# Patient Record
Sex: Male | Born: 1975 | Hispanic: Yes | Marital: Married | Smoking: Never smoker
Health system: Southern US, Community
[De-identification: ages and names within clinical notes are randomized; demographics above are authoritative.]

## PROBLEM LIST (undated history)

## (undated) DIAGNOSIS — E119 Type 2 diabetes mellitus without complications: Secondary | ICD-10-CM

## (undated) DIAGNOSIS — M1990 Unspecified osteoarthritis, unspecified site: Secondary | ICD-10-CM

## (undated) DIAGNOSIS — M109 Gout, unspecified: Secondary | ICD-10-CM

## (undated) HISTORY — PX: JOINT REPLACEMENT: SHX530

## (undated) HISTORY — PX: KNEE SURGERY: SHX244

---

## 2014-09-25 ENCOUNTER — Emergency Department (HOSPITAL_COMMUNITY)
Admission: EM | Admit: 2014-09-25 | Discharge: 2014-09-25 | Disposition: A | Discharge status: Home / Self Care | Payer: Medicaid - Out of State | Attending: Emergency Medicine | Admitting: Emergency Medicine

## 2014-09-25 ENCOUNTER — Encounter (HOSPITAL_COMMUNITY): Payer: Self-pay | Admitting: Emergency Medicine

## 2014-09-25 DIAGNOSIS — R109 Unspecified abdominal pain: Secondary | ICD-10-CM | POA: Diagnosis present

## 2014-09-25 DIAGNOSIS — M545 Low back pain, unspecified: Secondary | ICD-10-CM

## 2014-09-25 LAB — URINALYSIS, ROUTINE W REFLEX MICROSCOPIC
Bilirubin Urine: NEGATIVE
Glucose, UA: NEGATIVE mg/dL
Hgb urine dipstick: NEGATIVE
KETONES UR: NEGATIVE mg/dL
LEUKOCYTES UA: NEGATIVE
NITRITE: NEGATIVE
PROTEIN: NEGATIVE mg/dL
SPECIFIC GRAVITY, URINE: 1.016 (ref 1.005–1.030)
Urobilinogen, UA: 0.2 mg/dL (ref 0.0–1.0)
pH: 5.5 (ref 5.0–8.0)

## 2014-09-25 LAB — I-STAT TROPONIN, ED: Troponin i, poc: 0 ng/mL (ref 0.00–0.08)

## 2014-09-25 LAB — COMPREHENSIVE METABOLIC PANEL
ALK PHOS: 93 U/L (ref 38–126)
ALT: 29 U/L (ref 17–63)
AST: 25 U/L (ref 15–41)
Albumin: 3.9 g/dL (ref 3.5–5.0)
Anion gap: 10 (ref 5–15)
BUN: 13 mg/dL (ref 6–20)
CO2: 25 mmol/L (ref 22–32)
Calcium: 9.4 mg/dL (ref 8.9–10.3)
Chloride: 103 mmol/L (ref 101–111)
Creatinine, Ser: 0.95 mg/dL (ref 0.61–1.24)
Glucose, Bld: 117 mg/dL — ABNORMAL HIGH (ref 65–99)
Potassium: 3.9 mmol/L (ref 3.5–5.1)
SODIUM: 138 mmol/L (ref 135–145)
TOTAL PROTEIN: 7.4 g/dL (ref 6.5–8.1)
Total Bilirubin: 0.9 mg/dL (ref 0.3–1.2)

## 2014-09-25 LAB — CBC WITH DIFFERENTIAL/PLATELET
BASOS ABS: 0 10*3/uL (ref 0.0–0.1)
BASOS PCT: 0 % (ref 0–1)
EOS ABS: 0.1 10*3/uL (ref 0.0–0.7)
EOS PCT: 1 % (ref 0–5)
HCT: 43.6 % (ref 39.0–52.0)
Hemoglobin: 14.4 g/dL (ref 13.0–17.0)
LYMPHS PCT: 24 % (ref 12–46)
Lymphs Abs: 3.1 10*3/uL (ref 0.7–4.0)
MCH: 29.6 pg (ref 26.0–34.0)
MCHC: 33 g/dL (ref 30.0–36.0)
MCV: 89.7 fL (ref 78.0–100.0)
Monocytes Absolute: 0.8 10*3/uL (ref 0.1–1.0)
Monocytes Relative: 6 % (ref 3–12)
Neutro Abs: 9 10*3/uL — ABNORMAL HIGH (ref 1.7–7.7)
Neutrophils Relative %: 69 % (ref 43–77)
Platelets: 281 10*3/uL (ref 150–400)
RBC: 4.86 MIL/uL (ref 4.22–5.81)
RDW: 13.3 % (ref 11.5–15.5)
WBC: 13.1 10*3/uL — AB (ref 4.0–10.5)

## 2014-09-25 LAB — LIPASE, BLOOD: Lipase: 15 U/L — ABNORMAL LOW (ref 22–51)

## 2014-09-25 MED ORDER — CYCLOBENZAPRINE HCL 10 MG PO TABS: 10.0000 mg | ORAL_TABLET | Freq: Three times a day (TID) | ORAL | Status: DC | PRN

## 2014-09-25 MED ORDER — HYDROCODONE-ACETAMINOPHEN 5-325 MG PO TABS
2.0000 | ORAL_TABLET | Freq: Once | ORAL | Status: AC
Start: 2014-09-25 — End: 2014-09-25
  Administered 2014-09-25: 2 via ORAL
  Filled 2014-09-25: qty 2

## 2014-09-25 MED ORDER — CYCLOBENZAPRINE HCL 10 MG PO TABS
10.0000 mg | ORAL_TABLET | Freq: Once | ORAL | Status: AC
  Administered 2014-09-25: 10 mg via ORAL
  Filled 2014-09-25: qty 1

## 2014-09-25 MED ORDER — KETOROLAC TROMETHAMINE 60 MG/2ML IM SOLN
60.0000 mg | Freq: Once | INTRAMUSCULAR | Status: AC
  Administered 2014-09-25: 60 mg via INTRAMUSCULAR
  Filled 2014-09-25: qty 2

## 2014-09-25 MED ORDER — NAPROXEN 500 MG PO TABS: 500.0000 mg | ORAL_TABLET | Freq: Two times a day (BID) | ORAL | Status: DC

## 2014-09-25 NOTE — ED Provider Notes (Signed)
CSN: [id number]     Arrival date & time 09/25/14  1825 History   First MD Initiated Contact with Patient 09/25/14 2005     Chief Complaint  Patient presents with  . Abdominal Pain  . Flank Pain     (Consider location/radiation/quality/duration/timing/severity/associated sxs/prior Treatment) HPI Comments: 39 year old male with no significant medical history, nonsmoker presents with worsening bilateral flank pain for 3 days worse with position and movement. No injuries however patient regularly left heavy things at work. Patient had dark urine with fishy odor 3 days prior that resolved. This improved with increased water intake. No weakness or numbness in his legs no urinary changes. No kidney stone history please no fevers or chills. Ache sensation  Patient is a 39 y.o. male presenting with abdominal pain and flank pain. The history is provided by the patient.  Abdominal Pain Associated symptoms: no chest pain, no chills, no dysuria, no fever, no shortness of breath and no vomiting   Flank Pain Associated symptoms include abdominal pain (mostly back no focal abdominal pain). Pertinent negatives include no chest pain, no headaches and no shortness of breath.    History reviewed. No pertinent past medical history. Past Surgical History  Procedure Laterality Date  . Knee surgery     History reviewed. No pertinent family history. History  Substance Use Topics  . Smoking status: Never Smoker   . Smokeless tobacco: Not on file  . Alcohol Use: Yes     Comment: occasional    Review of Systems  Constitutional: Negative for fever and chills.  HENT: Negative for congestion.   Eyes: Negative for visual disturbance.  Respiratory: Negative for shortness of breath.   Cardiovascular: Negative for chest pain.  Gastrointestinal: Positive for abdominal pain (mostly back no focal abdominal pain). Negative for vomiting.  Genitourinary: Positive for flank pain. Negative for dysuria.   Musculoskeletal: Positive for back pain. Negative for neck pain and neck stiffness.  Skin: Negative for rash.  Neurological: Negative for light-headedness and headaches.      Allergies  Shrimp  Home Medications   Prior to Admission medications   Medication Sig Start Date End Date Taking? Authorizing Provider  acetaminophen (TYLENOL) 500 MG tablet Take 500 mg by mouth every 6 (six) hours as needed for moderate pain.   Yes Historical Provider, MD  ibuprofen (ADVIL,MOTRIN) 200 MG tablet Take 400 mg by mouth every 6 (six) hours as needed for headache or moderate pain.   Yes Historical Provider, MD  cyclobenzaprine (FLEXERIL) 10 MG tablet Take 1 tablet (10 mg total) by mouth 3 (three) times daily as needed for muscle spasms. 09/25/14   Blane Ohara, MD  naproxen (NAPROSYN) 500 MG tablet Take 1 tablet (500 mg total) by mouth 2 (two) times daily. 09/25/14   Blane Ohara, MD   BP 136/67 mmHg  Pulse 80  Temp(Src) 98.4 F (36.9 C) (Oral)  Resp 18  SpO2 98% Physical Exam  Constitutional: He is oriented to person, place, and time. He appears well-developed and well-nourished.  HENT:  Head: Normocephalic and atraumatic.  Eyes: Conjunctivae are normal. Right eye exhibits no discharge. Left eye exhibits no discharge.  Neck: Normal range of motion. Neck supple. No tracheal deviation present.  Cardiovascular: Normal rate and regular rhythm.   Pulmonary/Chest: Effort normal and breath sounds normal.  Abdominal: Soft. He exhibits no distension. There is no tenderness. There is no guarding.  Musculoskeletal: He exhibits tenderness (tender and tight musculature paraspinal lumbar bilateral, no midline tenderness). He exhibits no edema.  Neurological: He is alert and oriented to person, place, and time. He has normal strength. No sensory deficit.  Skin: Skin is warm. No rash noted.  Psychiatric: He has a normal mood and affect.  Nursing note and vitals reviewed.   ED Course  Procedures  (including critical care time) Labs Review Labs Reviewed  CBC WITH DIFFERENTIAL/PLATELET - Abnormal; Notable for the following:    WBC 13.1 (*)    Neutro Abs 9.0 (*)    All other components within normal limits  COMPREHENSIVE METABOLIC PANEL - Abnormal; Notable for the following:    Glucose, Bld 117 (*)    All other components within normal limits  LIPASE, BLOOD - Abnormal; Notable for the following:    Lipase 15 (*)    All other components within normal limits  URINALYSIS, ROUTINE W REFLEX MICROSCOPIC (NOT AT [facility]) - Abnormal; Notable for the following:    APPearance CLOUDY (*)    All other components within normal limits  I-STAT TROPOININ, ED    Imaging Review No results found.   EKG Interpretation   Date/Time:  Sunday September 25 2014 18:40:53 EDT Ventricular Rate:  85 PR Interval:  169 QRS Duration: 83 QT Interval:  341 QTC Calculation: 405 R Axis:   63 Text Interpretation:  Sinus rhythm Probable left atrial enlargement  Confirmed by Emad Brechtel  MD, Hamp Moreland ([id number]) on 09/25/2014 8:07:01 PM      MDM   Final diagnoses:  Lumbar pain determined by palpation   Patient presents with back pain and urinary concerns. Blood work and urinalysis reviewed unremarkable. Patient has reproducible back pain on exam. Low suspicion for any emergent process at this time. Discussed supportive care and strict reasons to return.  Patient and family okay holding on CT scan is very low pretest probability.  Results and differential diagnosis were discussed with the patient/parent/guardian. Close follow up outpatient was discussed, comfortable with the plan.   Medications  HYDROcodone-acetaminophen (NORCO/VICODIN) 5-325 MG per tablet 2 tablet (2 tablets Oral Given 09/25/14 2123)  cyclobenzaprine (FLEXERIL) tablet 10 mg (10 mg Oral Given 09/25/14 2123)  ketorolac (TORADOL) injection 60 mg (60 mg Intramuscular Given 09/25/14 2123)    Filed Vitals:   09/25/14 1836 09/25/14 2056  BP: 140/90 136/67   Pulse: 84 80  Temp: 98.4 F (36.9 C) 98.4 F (36.9 C)  TempSrc: Oral Oral  Resp: 18 18  SpO2: 99% 98%    Final diagnoses:  Lumbar pain determined by palpation        Blane Ohara, MD 09/25/14 2138

## 2014-09-25 NOTE — ED Notes (Signed)
Pt c/o lateral back pain across entire lateral back x 4 days, worsened 2 days ago, along with sharp abdominal pain, worse at [facility]Q. Pt denies dysuria, but states he had green malodorous urine 2 days ago.

## 2014-09-25 NOTE — ED Notes (Signed)
Pt reports flank pain x 3 days that is worse when he lays down. 4 days ago pt reports his urine had a "fishy" odor. 3 days ago pt reports his urine was greenish in color. He increased his fluid intake and now his urine is "tea colored"

## 2014-09-25 NOTE — Discharge Instructions (Signed)
If you were given medicines take as directed.  If you are on coumadin or contraceptives realize their levels and effectiveness is altered by many different medicines.  If you have any reaction (rash, tongues swelling, other) to the medicines stop taking and see a physician.    If your blood pressure was elevated in the ER make sure you follow up for management with a primary doctor or return for chest pain, shortness of breath or stroke symptoms. Return for fevers, leg weakness or numbness, urinary incontinence or retention or new symptoms. Please follow up as directed and return to the ER or see a physician for new or worsening symptoms.  Thank you. Filed Vitals:   09/25/14 1836 09/25/14 2056  BP: 140/90 136/67  Pulse: 84 80  Temp: 98.4 F (36.9 C) 98.4 F (36.9 C)  TempSrc: Oral Oral  Resp: 18 18  SpO2: 99% 98%

## 2015-08-12 ENCOUNTER — Emergency Department (HOSPITAL_COMMUNITY)
Admission: EM | Admit: 2015-08-12 | Discharge: 2015-08-12 | Disposition: A | Discharge status: Home / Self Care | Payer: Medicaid - Out of State | Attending: Emergency Medicine | Admitting: Emergency Medicine

## 2015-08-12 ENCOUNTER — Encounter (HOSPITAL_COMMUNITY): Payer: Self-pay | Admitting: *Deleted

## 2015-08-12 DIAGNOSIS — M25462 Effusion, left knee: Secondary | ICD-10-CM | POA: Insufficient documentation

## 2015-08-12 DIAGNOSIS — Z791 Long term (current) use of non-steroidal anti-inflammatories (NSAID): Secondary | ICD-10-CM | POA: Insufficient documentation

## 2015-08-12 DIAGNOSIS — M25561 Pain in right knee: Secondary | ICD-10-CM | POA: Insufficient documentation

## 2015-08-12 DIAGNOSIS — M25562 Pain in left knee: Secondary | ICD-10-CM | POA: Insufficient documentation

## 2015-08-12 DIAGNOSIS — Z9889 Other specified postprocedural states: Secondary | ICD-10-CM | POA: Insufficient documentation

## 2015-08-12 MED ORDER — IBUPROFEN 600 MG PO TABS: 600.0000 mg | ORAL_TABLET | Freq: Four times a day (QID) | ORAL | Status: DC | PRN

## 2015-08-12 NOTE — ED Notes (Signed)
Pt reports his knee pain started last night In Lt knee and the right knee started to hurt this morning. Pt reports swelling to both knees.

## 2015-08-12 NOTE — ED Notes (Signed)
Pt verbalized understanding of d/c instructions and has no further questions. Pt stable and ambulatory with crutches. Pt to follow up with ortho.

## 2015-08-12 NOTE — Discharge Instructions (Signed)
Dolor de rodilla (Knee Pain) El dolor de rodilla es un sntoma muy comn y puede tener muchas causas. Suele desaparecer cuando se siguen las indicaciones del mdico en lo que respecta a aliviar el dolor y las molestias en la casa. Sin embargo, puede progresar hasta convertirse en una afeccin que requiere tratamiento. Algunas afecciones pueden incluir lo siguiente:  Artritis por uso y desgaste (artrosis).  Artritis por hinchazn e irritacin (artritis reumatoide o gota).  Un quiste o un crecimiento en la rodilla.  Una infeccin en la articulacin de la rodilla.  Una lesin que no se cura.  Dao, hinchazn o irritacin de los tejidos que sostienen la rodilla (distensin de ligamentos o tendinitis). Si el dolor de rodilla persiste, tal vez haya que realizar ms estudios para diagnosticar la afeccin, los cuales pueden incluir radiografas u otros estudios de diagnstico por imgenes de la rodilla. Adems, es posible que haya que extraerle lquido de la rodilla. El tratamiento del dolor continuo de rodilla depende de la causa, pero puede incluir lo siguiente:  Medicamentos para aliviar el dolor o reducir la hinchazn.  Inyecciones de corticoides en la rodilla.  Fisioterapia.  Ciruga. INSTRUCCIONES PARA EL CUIDADO EN EL HOGAR  Tome los medicamentos solamente como se lo haya indicado el mdico.  Mantenga la rodilla en reposo y en alto (elevada) mientras est descansando.  No haga cosas que le causen dolor o que lo intensifiquen.  Evite las actividades o los ejercicios de alto impacto, como correr, saltar la soga o hacer saltos de tijera.  Aplique hielo sobre la zona de la rodilla:  Ponga el hielo en una bolsa plstica.  Coloque una toalla entre la piel y la bolsa de hielo.  Coloque el hielo durante 20 minutos, 2 a 3 veces por da.  Pregntele al mdico si debe usar una rodillera elstica.  Cuando duerma, pngase una almohada debajo de la rodilla.  Baje de peso si es  necesario. El peso extra puede generar presin en la rodilla.  No consuma ningn producto que contenga tabaco, lo que incluye cigarrillos, tabaco de mascar o cigarrillos electrnicos. Si necesita ayuda para dejar de fumar, consulte al mdico. Fumar puede retrasar la curacin de cualquier problema que tenga en el hueso y la articulacin. SOLICITE ATENCIN MDICA SI:  El dolor de rodilla contina, cambia o empeora.  Tiene fiebre junto con dolor de rodilla.  La rodilla se le tuerce o se le traba.  La rodilla est ms hinchada. SOLICITE ATENCIN MDICA DE INMEDIATO SI:   La articulacin de la rodilla est caliente al tacto.  Tiene dolor en el pecho o dificultad para respirar.   Esta informacin no tiene como fin reemplazar el consejo del mdico. Asegrese de hacerle al mdico cualquier pregunta que tenga.   Document Released: 09/18/2007 Document Revised: 04/22/2014 Elsevier Interactive Patient Education 2016 [employer]  

## 2015-08-12 NOTE — ED Provider Notes (Signed)
CSN: [id number]     Arrival date & time 08/12/15  1844 History  By signing my name below, I, Emmanuella Mensah, attest that this documentation has been prepared under the direction and in the presence of Roxy Horseman, PA-C. Electronically Signed: Angelene Giovanni, ED Scribe. 08/12/2015. 7:00 PM.    Chief Complaint  Patient presents with  . Knee Pain   The history is provided by the patient. No language interpreter was used.   HPI Comments: Benjamen Koelling is a 40 y.o. male with a hx of right knee surgery who presents to the Emergency Department complaining of gradually worsening left knee pain onset yesterday. Pt reports associated left knee swelling. He adds that his right knee is beginning to bother him as well. He states that he was sweeping and mopping the floors last night prior to onset. No alleviating factors noted. Pt has not tried any medication PTA. Pt denies any difficulties with ambulation, fever, chills, or any open wounds.   History reviewed. No pertinent past medical history. Past Surgical History  Procedure Laterality Date  . Knee surgery      RT knee surgery   History reviewed. No pertinent family history. Social History  Substance Use Topics  . Smoking status: Never Smoker   . Smokeless tobacco: None  . Alcohol Use: Yes     Comment: occasional    Review of Systems  Constitutional: Negative for chills.  Musculoskeletal: Positive for joint swelling and arthralgias (left knee). Negative for gait problem.  Skin: Negative for wound.      Allergies  Shrimp  Home Medications   Prior to Admission medications   Medication Sig Start Date End Date Taking? Authorizing Provider  acetaminophen (TYLENOL) 500 MG tablet Take 500 mg by mouth every 6 (six) hours as needed for moderate pain.    Historical Provider, MD  cyclobenzaprine (FLEXERIL) 10 MG tablet Take 1 tablet (10 mg total) by mouth 3 (three) times daily as needed for muscle spasms. 09/25/14   Blane Ohara, MD   ibuprofen (ADVIL,MOTRIN) 200 MG tablet Take 400 mg by mouth every 6 (six) hours as needed for headache or moderate pain.    Historical Provider, MD  naproxen (NAPROSYN) 500 MG tablet Take 1 tablet (500 mg total) by mouth 2 (two) times daily. 09/25/14   Blane Ohara, MD   BP 135/82 mmHg  Pulse 81  Temp(Src) 98.6 F (37 C) (Oral)  Resp 18  SpO2 100% Physical Exam  Constitutional: He is oriented to person, place, and time. He appears well-developed and well-nourished.  HENT:  Head: Normocephalic and atraumatic.  Eyes: Conjunctivae and EOM are normal.  Neck: Normal range of motion.  Cardiovascular: Normal rate.   Pulmonary/Chest: Effort normal.  Abdominal: He exhibits no distension.  Musculoskeletal: Normal range of motion.  ROM and strength of bilateral knees is 5/5, ambulatory, no bony abnormality or deformity, mild swelling, tenderness to palpation of joint lines, no obvious effusion, no erythema, cellulitis, evidence of abscess, septic joint, or DVT.  Neurological: He is alert and oriented to person, place, and time.  Skin: Skin is dry.  Psychiatric: He has a normal mood and affect. His behavior is normal. Judgment and thought content normal.  Nursing note and vitals reviewed.   ED Course  Procedures (including critical care time) DIAGNOSTIC STUDIES: Oxygen Saturation is 100% on RA, normal by my interpretation.    COORDINATION OF CARE: 6:56 PM- Pt advised of plan for treatment and pt agrees. Explained that pt's presentation does not warrant  an x-ray. Pt will receive bilateral knee sleeve and crutches.   MDM   Final diagnoses:  Arthralgia of both knees    Patient with bilateral knee pain.  States that he was working last night and his left knee started to hurt him and then this morning when he was working his right knee started to hurt him.  VSS.  No evidence of septic joint, DVT, or infection.  Likely overuse injury.  Patient is ambulatory without difficulty, no indication  of imaging today.  Will treat with knee sleeves and NSAIDs.  Recommend close follow-up with ortho.  I personally performed the services described in this documentation, which was scribed in my presence. The recorded information has been reviewed and is accurate.     Roxy Horsemanobert Negin Hegg, PA-C 08/12/15 1905  Pricilla LovelessScott Goldston, MD 08/16/15 [phone]

## 2016-03-20 ENCOUNTER — Ambulatory Visit (INDEPENDENT_AMBULATORY_CARE_PROVIDER_SITE_OTHER): Payer: Self-pay

## 2016-03-20 ENCOUNTER — Encounter (INDEPENDENT_AMBULATORY_CARE_PROVIDER_SITE_OTHER): Payer: Self-pay

## 2016-03-20 ENCOUNTER — Ambulatory Visit (INDEPENDENT_AMBULATORY_CARE_PROVIDER_SITE_OTHER): Payer: Worker's Compensation | Admitting: Orthopaedic Surgery

## 2016-03-20 DIAGNOSIS — M25531 Pain in right wrist: Secondary | ICD-10-CM

## 2016-03-20 NOTE — Progress Notes (Signed)
Office Visit Note   Patient: Don Martin           Date of Birth: 1975-09-02           MRN: [id number] Visit Date: 03/20/2016              Requested by: No referring provider defined for this encounter. PCP: Pcp Not In System   Assessment & Plan: Visit Diagnoses:  1. Pain in right wrist     Plan: Given the significant swelling, pain, and dysfunction of his right dominant wrist combined with the plain radiographic findings and MR arthrogram is warranted to assess his wrist for ligamentous injury, cartilaginous injury even an occult fracture. He has a thumb spica wrist splint that he'll continue to wear. We'll put him on a six-day steroid taper and some tramadol as well given the amount of pain that he is having. We will see him back after the MRI arthrogram. We'll continue keep him out of work given his pain and dysfunction as well as discomfort and the fact that this is his dominant wrist.  Follow-Up Instructions: Return if symptoms worsen or fail to improve.   Orders:  Orders Placed This Encounter  Procedures  . XR Wrist Complete Right   No orders of the defined types were placed in this encounter.     Procedures: No procedures performed   Clinical Data: No additional findings.   Subjective: Chief Complaint  Patient presents with  . Right Wrist - Fracture    WC injury. DOI 02/22/16. Using machine to clean floor, turned machine around and hit arm on wall. Pain now radiating to elbow. Fingers swollen.wearing a velcro splint.   He is getting close to a month out from his original injury. He was placed in a Velcro wrist splint which looks like the thumb spica splint. He points to the dorsal side radial side and ulnar side of his wrist as source of pain. He denies a numbness and tingling but does report significant swelling and given that this is his dominant wrist it's detrimentally affecting his activities daily living and his quality of life. He states that he has not  injured this wrist before. HPI  Review of Systems He denies any chest pain, headache, shortness of breath, fever, chills, nausea, vomiting.  Objective: Vital Signs: There were no vitals taken for this visit.  Physical Exam He is alert and oriented 3. Ortho Exam Examination of his right wrist shows global swelling. There is tenderness over the anatomic snuffbox as well as the ulnar side of his wrist. He has pain with palmar flexion and dorsiflexion as well as radial and ulnar deviation. His hand is well-perfused he has palpable pulses in his wrist. He is able to move his fingers and thumb. It is really difficult to get a good exam in terms of ligamentous issues with his wrist due to the swelling and the amount of pain that he is having. There is no redness Specialty Comments:  No specialty comments available.  Imaging: Xr Wrist Complete Right  Result Date: 03/20/2016 3 views of his right wrist are obtained including a scaphoid AP and lateral of the wrist. 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 There is some slight widening of the scapholunate interval is difficult to tell whether there is nondisplaced fracture of the distal pole the scaphoid.    PMFS History: There are no active problems to display  for this patient.  No past medical history on file.  No family history on file.  Past Surgical History:  Procedure Laterality Date  . KNEE SURGERY     RT knee surgery   Social History   Occupational History  . Not on file.   Social History Main Topics  . Smoking status: Never Smoker  . Smokeless tobacco: Not on file  . Alcohol use Yes     Comment: occasional  . Drug use: No  . Sexual activity: Not on file

## 2016-03-21 ENCOUNTER — Other Ambulatory Visit (INDEPENDENT_AMBULATORY_CARE_PROVIDER_SITE_OTHER): Payer: Self-pay

## 2016-03-21 DIAGNOSIS — M25531 Pain in right wrist: Secondary | ICD-10-CM

## 2016-03-26 ENCOUNTER — Telehealth (INDEPENDENT_AMBULATORY_CARE_PROVIDER_SITE_OTHER): Payer: Self-pay | Admitting: Orthopaedic Surgery

## 2016-03-27 ENCOUNTER — Encounter (INDEPENDENT_AMBULATORY_CARE_PROVIDER_SITE_OTHER): Payer: Self-pay

## 2016-03-27 NOTE — Telephone Encounter (Signed)
Faxed letter to provided fax number.

## 2016-04-24 ENCOUNTER — Ambulatory Visit (INDEPENDENT_AMBULATORY_CARE_PROVIDER_SITE_OTHER): Payer: Worker's Compensation | Admitting: Orthopaedic Surgery

## 2016-04-24 ENCOUNTER — Encounter (INDEPENDENT_AMBULATORY_CARE_PROVIDER_SITE_OTHER): Payer: Self-pay | Admitting: Physician Assistant

## 2016-04-24 ENCOUNTER — Other Ambulatory Visit (INDEPENDENT_AMBULATORY_CARE_PROVIDER_SITE_OTHER): Payer: Self-pay

## 2016-04-24 DIAGNOSIS — M25531 Pain in right wrist: Secondary | ICD-10-CM

## 2016-04-24 MED ORDER — METHYLPREDNISOLONE ACETATE 40 MG/ML IJ SUSP
40.0000 mg | INTRAMUSCULAR | Status: AC | PRN
  Administered 2016-04-24: 40 mg

## 2016-04-24 MED ORDER — LIDOCAINE HCL 1 % IJ SOLN
1.0000 mL | INTRAMUSCULAR | Status: AC | PRN
  Administered 2016-04-24: 1 mL

## 2016-04-24 NOTE — Progress Notes (Signed)
Office Visit Note   Patient: Don Martin           Date of Birth: 1976/03/15           MRN: [id number] Visit Date: 04/24/2016              Requested by: No referring provider defined for this encounter. PCP: Pcp Not In System   Assessment & Plan: Visit Diagnoses:  1. Pain in right wrist     Plan: Given the MRI findings of his right dominant wrist and the nature of these injuries this does warrant a referral to a hand specialist for further evaluation and treatment. He tolerated the intra-articular steroid injection a place in his wrist. I want to try some time on him or 3 combined with a few days of Toradol orally. Also gave him some samples of a topical anti-inflammatory. He'll continue his wrist splint to try to come out of it for range of motion of his wrist. He'll continue intermittent ice and heat as well. We'll work on making that referral to the [employer] [facility] [place].  Follow-Up Instructions: Return if symptoms worsen or fail to improve.   Orders:  No orders of the defined types were placed in this encounter.  No orders of the defined types were placed in this encounter.     Procedures: Hand/UE Inj Date/Time: 04/24/2016 4:16 PM Performed by: Kathryne Hitch Authorized by: Kathryne Hitch   Indications:  Pain Condition: osteoarthritis   Medications:  1 mL lidocaine 1 %; 40 mg methylPREDNISolone acetate 40 MG/ML     Clinical Data: No additional findings.   Subjective: Chief Complaint  Patient presents with  . Right Wrist - Follow-up  . Follow-up    HPI The patient is a 41 year old right-hand dominant gentleman who injured his right wrist and a work-related accident on 02/22/2016. He is in a Velcro wrist splint and was sent to our office on 03/20/2016. At that time he had diffuse wrist swelling and based on x-rays showing widening of the scapholunate interval I recommended an MRI arthrogram to evaluate the ligamentous structures of the  wrist. He comes in today with an interpreter. He is still having significant right wrist pain. It's now detrimentally affected his quality of life and his activities daily living. He 70s his left wrist for everything. He still been wearing his wrist splint. A steroid taper I put him on did help some. Review of Systems He denies any other acute medical problems. He denies any fever and chills.  Objective: Vital Signs: There were no vitals taken for this visit.  Physical Exam He is alert and oriented and there is a interpreter with him. Ortho Exam Examination of his right wrist still shows global swelling dorsally hand volarly. Is difficult to get a good exam due to the pain of his wrist when I attempted dorsiflexion or palmar flexion. Specialty Comments:  No specialty comments available.  Imaging: No results found. The MRI arthrogram of his right wrist does show diffuse synovitis throughout the wrist and there is some osteopenic changes in the bone on the suggesting a complex regional pain syndrome. There is tears of the TFCC as well as the scapholunate ligament  PMFS History: There are no active problems to display for this patient.  No past medical history on file.  No family history on file.  Past Surgical History:  Procedure Laterality Date  . KNEE SURGERY     RT knee surgery   Social  History   Occupational History  . Not on file.   Social History Main Topics  . Smoking status: Never Smoker  . Smokeless tobacco: Not on file  . Alcohol use Yes     Comment: occasional  . Drug use: No  . Sexual activity: Not on file

## 2016-05-01 ENCOUNTER — Ambulatory Visit (INDEPENDENT_AMBULATORY_CARE_PROVIDER_SITE_OTHER): Payer: Worker's Compensation | Admitting: Orthopaedic Surgery

## 2017-01-11 ENCOUNTER — Encounter (HOSPITAL_COMMUNITY): Payer: Self-pay | Admitting: *Deleted

## 2017-01-11 ENCOUNTER — Emergency Department (HOSPITAL_COMMUNITY)
Admission: EM | Admit: 2017-01-11 | Discharge: 2017-01-11 | Disposition: A | Discharge status: Home / Self Care | Payer: No Typology Code available for payment source | Attending: Emergency Medicine | Admitting: Emergency Medicine

## 2017-01-11 ENCOUNTER — Emergency Department (HOSPITAL_COMMUNITY): Payer: No Typology Code available for payment source

## 2017-01-11 DIAGNOSIS — Y9389 Activity, other specified: Secondary | ICD-10-CM | POA: Diagnosis not present

## 2017-01-11 DIAGNOSIS — M25512 Pain in left shoulder: Secondary | ICD-10-CM | POA: Diagnosis present

## 2017-01-11 DIAGNOSIS — R1012 Left upper quadrant pain: Secondary | ICD-10-CM | POA: Diagnosis not present

## 2017-01-11 DIAGNOSIS — Z791 Long term (current) use of non-steroidal anti-inflammatories (NSAID): Secondary | ICD-10-CM | POA: Insufficient documentation

## 2017-01-11 DIAGNOSIS — Z79899 Other long term (current) drug therapy: Secondary | ICD-10-CM | POA: Insufficient documentation

## 2017-01-11 DIAGNOSIS — Y929 Unspecified place or not applicable: Secondary | ICD-10-CM | POA: Insufficient documentation

## 2017-01-11 DIAGNOSIS — Y999 Unspecified external cause status: Secondary | ICD-10-CM | POA: Insufficient documentation

## 2017-01-11 DIAGNOSIS — R109 Unspecified abdominal pain: Secondary | ICD-10-CM

## 2017-01-11 LAB — COMPREHENSIVE METABOLIC PANEL
ALBUMIN: 4 g/dL (ref 3.5–5.0)
ALT: 60 U/L (ref 17–63)
AST: 36 U/L (ref 15–41)
Alkaline Phosphatase: 100 U/L (ref 38–126)
Anion gap: 9 (ref 5–15)
BUN: 12 mg/dL (ref 6–20)
CHLORIDE: 106 mmol/L (ref 101–111)
CO2: 23 mmol/L (ref 22–32)
Calcium: 9.3 mg/dL (ref 8.9–10.3)
Creatinine, Ser: 0.95 mg/dL (ref 0.61–1.24)
GFR calc Af Amer: >60 mL/min (ref >60)
Glucose, Bld: 146 mg/dL — ABNORMAL HIGH (ref 65–99)
POTASSIUM: 4.1 mmol/L (ref 3.5–5.1)
SODIUM: 138 mmol/L (ref 135–145)
Total Bilirubin: 1 mg/dL (ref 0.3–1.2)
Total Protein: 7.8 g/dL (ref 6.5–8.1)

## 2017-01-11 LAB — CBC
HCT: 42.9 % (ref 39.0–52.0)
Hemoglobin: 15.2 g/dL (ref 13.0–17.0)
MCH: 31 pg (ref 26.0–34.0)
MCHC: 35.4 g/dL (ref 30.0–36.0)
MCV: 87.4 fL (ref 78.0–100.0)
PLATELETS: 278 10*3/uL (ref 150–400)
RBC: 4.91 MIL/uL (ref 4.22–5.81)
RDW: 13.6 % (ref 11.5–15.5)
WBC: 9.6 10*3/uL (ref 4.0–10.5)

## 2017-01-11 LAB — URINALYSIS, ROUTINE W REFLEX MICROSCOPIC
Bacteria, UA: NONE SEEN
Bilirubin Urine: NEGATIVE
GLUCOSE, UA: NEGATIVE mg/dL
HGB URINE DIPSTICK: NEGATIVE
Ketones, ur: NEGATIVE mg/dL
Nitrite: NEGATIVE
Protein, ur: NEGATIVE mg/dL
RBC / HPF: NONE SEEN RBC/hpf (ref 0–5)
Specific Gravity, Urine: 1.036 — ABNORMAL HIGH (ref 1.005–1.030)
pH: 5 (ref 5.0–8.0)

## 2017-01-11 MED ORDER — MORPHINE SULFATE (PF) 4 MG/ML IV SOLN
4.0000 mg | Freq: Once | INTRAVENOUS | Status: AC
  Administered 2017-01-11: 4 mg via INTRAVENOUS
  Filled 2017-01-11: qty 1

## 2017-01-11 MED ORDER — IOPAMIDOL (ISOVUE-300) INJECTION 61%
INTRAVENOUS | Status: AC
  Filled 2017-01-11: qty 100

## 2017-01-11 MED ORDER — IBUPROFEN 800 MG PO TABS: 800.0000 mg | ORAL_TABLET | Freq: Three times a day (TID) | ORAL | 0 refills | Status: DC

## 2017-01-11 MED ORDER — METHOCARBAMOL 500 MG PO TABS: 500.0000 mg | ORAL_TABLET | Freq: Two times a day (BID) | ORAL | 0 refills | Status: DC

## 2017-01-11 MED ORDER — IOPAMIDOL (ISOVUE-300) INJECTION 61%
100.0000 mL | Freq: Once | INTRAVENOUS | Status: AC | PRN
  Administered 2017-01-11: 100 mL via INTRAVENOUS
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
## 2017-01-11 NOTE — ED Notes (Signed)
Patient transported to X-ray 
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
## 2017-01-11 NOTE — ED Notes (Signed)
Patient ambulatory to restroom without difficulty. 

## 2017-01-11 NOTE — Discharge Instructions (Signed)
Your workup is reassuring, imaging shows no evidence of internal organ damage, or fractures. Muscle soreness will likely increase over the next 2 days and then start to improve, you may take ibuprofen and Robaxin to help control pain. I also encouraged you to use heat and ice on her left shoulder. Return to the emergency department if pain worsens, or you develop new or concerning symptoms. Otherwise please follow up with your primary doctor.

## 2018-10-20 ENCOUNTER — Encounter (HOSPITAL_COMMUNITY): Payer: Self-pay | Admitting: *Deleted

## 2018-10-20 ENCOUNTER — Other Ambulatory Visit: Payer: Self-pay

## 2018-10-20 ENCOUNTER — Emergency Department (HOSPITAL_COMMUNITY)
Admission: EM | Admit: 2018-10-20 | Discharge: 2018-10-20 | Disposition: A | Discharge status: Home / Self Care | Payer: Medicaid Other | Attending: Emergency Medicine | Admitting: Emergency Medicine

## 2018-10-20 DIAGNOSIS — Z20828 Contact with and (suspected) exposure to other viral communicable diseases: Secondary | ICD-10-CM | POA: Diagnosis not present

## 2018-10-20 DIAGNOSIS — M25472 Effusion, left ankle: Secondary | ICD-10-CM | POA: Diagnosis not present

## 2018-10-20 DIAGNOSIS — R05 Cough: Secondary | ICD-10-CM | POA: Insufficient documentation

## 2018-10-20 DIAGNOSIS — M2540 Effusion, unspecified joint: Secondary | ICD-10-CM

## 2018-10-20 DIAGNOSIS — M79672 Pain in left foot: Secondary | ICD-10-CM | POA: Diagnosis present

## 2018-10-20 MED ORDER — IBUPROFEN 800 MG PO TABS: 800.0000 mg | ORAL_TABLET | Freq: Three times a day (TID) | ORAL | 0 refills | Status: DC

## 2018-10-20 MED ORDER — OXYCODONE-ACETAMINOPHEN 5-325 MG PO TABS: 1.0000 | ORAL_TABLET | Freq: Four times a day (QID) | ORAL | 0 refills | Status: DC | PRN

## 2018-10-20 NOTE — ED Provider Notes (Signed)
[facility]   CSN: [id number] Arrival date & time: 10/20/18  1116   History   Chief Complaint Chief Complaint  Patient presents with  . Foot Pain    HPI Don Martin is a 43 y.o. male.     HPI   43 year old-year-old male presents today with complaints of foot and ankle pain.  Patient notes 2 days ago he developed pain in his left ankle.  He also notes pain in his left third toe.  He notes minor swelling, he notes developing pain and swelling to his right second toe.  He denies any fever, or upper extremity swelling or pain.  He notes a history of joint effusion in his right knee previously but was told this was not infection or gout.  He denies any trauma.  No medications prior to arrival.  He did take anti-inflammatory last night which improved his symptoms.  He notes a cough over the last several days, no shortness of breath, nonproductive no associated chest pain.  He notes he has been around a COVID positive person.    History reviewed. No pertinent past medical history.  There are no active problems to display for this patient.   Past Surgical History:  Procedure Laterality Date  . KNEE SURGERY     RT knee surgery        Home Medications    Prior to Admission medications   Medication Sig Start Date End Date Taking? Authorizing Provider  ibuprofen (ADVIL) 800 MG tablet Take 1 tablet (800 mg total) by mouth 3 (three) times daily. 10/20/18   Ajani Schnieders, Tinnie GensJeffrey, PA-C  methocarbamol (ROBAXIN) 500 MG tablet Take 1 tablet (500 mg total) by mouth 2 (two) times daily. 01/11/17   Dartha LodgeFord, Kelsey N, PA-C  oxyCODONE-acetaminophen (PERCOCET/ROXICET) 5-325 MG tablet Take 1 tablet by mouth every 6 (six) hours as needed. 10/20/18   Charlie Seda, Tinnie GensJeffrey, PA-C  traMADol (ULTRAM) 50 MG tablet TK 1 TO 2 TS PO Q 8 TO 12 H PRN P 03/20/16   [provider]    Family History History reviewed. No pertinent family history.  Social History  Social History   Tobacco Use  . Smoking status: Never Smoker  . Smokeless tobacco: Never Used  Substance Use Topics  . Alcohol use: Yes    Comment: occasional  . Drug use: No     Allergies   Shrimp [shellfish allergy]   Review of Systems Review of Systems  All other systems reviewed and are negative.    Physical Exam Updated Vital Signs BP 125/87 (BP Location: Right Arm)   Pulse (!) 58   Temp 99 F (37.2 C) (Oral)   Resp 16   Ht 6\' 2"  (1.88 m)   Wt 111.1 kg   SpO2 100%   BMI 31.46 kg/m   Physical Exam Vitals signs and nursing note reviewed.  Constitutional:      Appearance: He is well-developed.  HENT:     Head: Normocephalic and atraumatic.  Eyes:     General: No scleral icterus.       Right eye: No discharge.        Left eye: No discharge.     Conjunctiva/sclera: Conjunctivae normal.     Pupils: Pupils are equal, round, and reactive to light.  Neck:     Musculoskeletal: Normal range of motion.     Vascular: No JVD.     Trachea: No tracheal deviation.  Pulmonary:     Effort: Pulmonary  effort is normal. No respiratory distress.     Breath sounds: Normal breath sounds. No stridor. No wheezing.  Musculoskeletal:     Comments: Minor redness and swelling at the right third toe at the PIP-joint with full active range of motion-minor swelling along the left lateral ankle, ankle with full active range of motion, pain at the left third toe  Neurological:     Mental Status: He is alert and oriented to person, place, and time.     Coordination: Coordination normal.  Psychiatric:        Behavior: Behavior normal.        Thought Content: Thought content normal.        Judgment: Judgment normal.      ED Treatments / Results  Labs (all labs ordered are listed, but only abnormal results are displayed) Labs Reviewed  NOVEL CORONAVIRUS, NAA (HOSPITAL ORDER, SEND-OUT TO REF LAB)    EKG None  Radiology No results found.  Procedures Procedures (including  critical care time)  Medications Ordered in ED Medications - No data to display   Initial Impression / Assessment and Plan / ED Course  I have reviewed the triage vital signs and the nursing notes.  Pertinent labs & imaging results that were available during my care of the patient were reviewed by me and considered in my medical decision making (see chart for details).         Assessment/Plan: 43 year old male presents today right pain.  He has a son several different sites.  He has no signs of infectious etiology, question reactive arthritis.  I do find it reasonable start him on anti-inflammatory with close follow-up here in the ED if symptoms do not improve or begin to worsen.  He verbalized understanding and agreement to today's plan had no further questions or concerns at time of discharge.  Patient also tested for COVID given his exposure and cough.  No signs of severe lower respiratory involvement.    Final Clinical Impressions(s) / ED Diagnoses   Final diagnoses:  Joint swelling    ED Discharge Orders         Ordered    ibuprofen (ADVIL) 800 MG tablet  3 times daily     10/20/18 1220    oxyCODONE-acetaminophen (PERCOCET/ROXICET) 5-325 MG tablet  Every 6 hours PRN     10/20/18 1220           Okey Regal, PA-C 10/20/18 1722    Blanchie Dessert, MD 10/23/18 2132

## 2018-10-20 NOTE — ED Notes (Signed)
Patient verbalizes understanding of discharge instructions. Opportunity for questioning and answers were provided. Armband removed by staff, pt discharged from ED.  

## 2018-10-20 NOTE — Discharge Instructions (Addendum)
Please read the attached information.  As we discussed your joint swelling is unlikely to be secondary to an infection.  If you develop any new or worsening signs or symptoms please return immediately for repeat evaluation.  Please use medication as directed and discontinue using if you have any concerns.

## 2018-10-20 NOTE — ED Triage Notes (Signed)
Pt reports swelling to right toe and left ankle with pain, denies hx of gout. Also reports recent exposure to covid but only symptom he reports is occ cough, wants to be tested. No distress noted at triage.

## 2018-10-21 LAB — NOVEL CORONAVIRUS, NAA (HOSP ORDER, SEND-OUT TO REF LAB; TAT 18-24 HRS): SARS-CoV-2, NAA: NOT DETECTED

## 2018-12-22 ENCOUNTER — Other Ambulatory Visit: Payer: Self-pay

## 2018-12-22 ENCOUNTER — Emergency Department (HOSPITAL_COMMUNITY)
Admission: EM | Admit: 2018-12-22 | Discharge: 2018-12-23 | Disposition: A | Discharge status: Home / Self Care | Payer: Medicaid Other | Attending: Emergency Medicine | Admitting: Emergency Medicine

## 2018-12-22 ENCOUNTER — Encounter (HOSPITAL_COMMUNITY): Payer: Self-pay | Admitting: *Deleted

## 2018-12-22 DIAGNOSIS — R197 Diarrhea, unspecified: Secondary | ICD-10-CM | POA: Diagnosis not present

## 2018-12-22 DIAGNOSIS — Z79899 Other long term (current) drug therapy: Secondary | ICD-10-CM | POA: Insufficient documentation

## 2018-12-22 DIAGNOSIS — R112 Nausea with vomiting, unspecified: Secondary | ICD-10-CM | POA: Insufficient documentation

## 2018-12-22 DIAGNOSIS — Z96651 Presence of right artificial knee joint: Secondary | ICD-10-CM | POA: Insufficient documentation

## 2018-12-22 DIAGNOSIS — R1031 Right lower quadrant pain: Secondary | ICD-10-CM | POA: Insufficient documentation

## 2018-12-22 DIAGNOSIS — M545 Low back pain: Secondary | ICD-10-CM | POA: Diagnosis not present

## 2018-12-22 DIAGNOSIS — R109 Unspecified abdominal pain: Secondary | ICD-10-CM

## 2018-12-22 DIAGNOSIS — R030 Elevated blood-pressure reading, without diagnosis of hypertension: Secondary | ICD-10-CM | POA: Insufficient documentation

## 2018-12-22 DIAGNOSIS — T783XXA Angioneurotic edema, initial encounter: Secondary | ICD-10-CM | POA: Diagnosis not present

## 2018-12-22 HISTORY — DX: Gout, unspecified: M10.9

## 2018-12-22 LAB — URINALYSIS, ROUTINE W REFLEX MICROSCOPIC
Bilirubin Urine: NEGATIVE
Glucose, UA: NEGATIVE mg/dL
Hgb urine dipstick: NEGATIVE
Ketones, ur: NEGATIVE mg/dL
Leukocytes,Ua: NEGATIVE
Nitrite: NEGATIVE
Protein, ur: NEGATIVE mg/dL
Specific Gravity, Urine: 1.01 (ref 1.005–1.030)
pH: 5 (ref 5.0–8.0)

## 2018-12-22 NOTE — ED Triage Notes (Signed)
Pt is here with left lower back pain that started this evening and remains constant.  Pt has swelling to lower lip that started about 30 minutes ago.  Pt has no airway impairment.  Not on an ace inhibitor.

## 2018-12-23 ENCOUNTER — Other Ambulatory Visit: Payer: Self-pay

## 2018-12-23 ENCOUNTER — Encounter (HOSPITAL_COMMUNITY): Payer: Self-pay | Admitting: Radiology

## 2018-12-23 ENCOUNTER — Emergency Department (HOSPITAL_COMMUNITY): Payer: Medicaid Other

## 2018-12-23 LAB — CBC WITH DIFFERENTIAL/PLATELET
Abs Immature Granulocytes: 0.13 10*3/uL — ABNORMAL HIGH (ref 0.00–0.07)
Basophils Absolute: 0.1 10*3/uL (ref 0.0–0.1)
Basophils Relative: 1 %
Eosinophils Absolute: 0.5 10*3/uL (ref 0.0–0.5)
Eosinophils Relative: 5 %
HCT: 39.9 % (ref 39.0–52.0)
Hemoglobin: 13 g/dL (ref 13.0–17.0)
Immature Granulocytes: 1 %
Lymphocytes Relative: 28 %
Lymphs Abs: 2.9 10*3/uL (ref 0.7–4.0)
MCH: 28.8 pg (ref 26.0–34.0)
MCHC: 32.6 g/dL (ref 30.0–36.0)
MCV: 88.3 fL (ref 80.0–100.0)
Monocytes Absolute: 0.7 10*3/uL (ref 0.1–1.0)
Monocytes Relative: 6 %
Neutro Abs: 6 10*3/uL (ref 1.7–7.7)
Neutrophils Relative %: 59 %
Platelets: 294 10*3/uL (ref 150–400)
RBC: 4.52 MIL/uL (ref 4.22–5.81)
RDW: 13.3 % (ref 11.5–15.5)
WBC: 10.2 10*3/uL (ref 4.0–10.5)
nRBC: 0 % (ref 0.0–0.2)

## 2018-12-23 LAB — COMPREHENSIVE METABOLIC PANEL
ALT: 121 U/L — ABNORMAL HIGH (ref 0–44)
AST: 69 U/L — ABNORMAL HIGH (ref 15–41)
Albumin: 3 g/dL — ABNORMAL LOW (ref 3.5–5.0)
Alkaline Phosphatase: 122 U/L (ref 38–126)
Anion gap: 7 (ref 5–15)
BUN: 9 mg/dL (ref 6–20)
CO2: 23 mmol/L (ref 22–32)
Calcium: 8.7 mg/dL — ABNORMAL LOW (ref 8.9–10.3)
Chloride: 105 mmol/L (ref 98–111)
Creatinine, Ser: 1 mg/dL (ref 0.61–1.24)
GFR calc Af Amer: >60 mL/min (ref >60)
GFR calc non Af Amer: >60 mL/min (ref >60)
Glucose, Bld: 124 mg/dL — ABNORMAL HIGH (ref 70–99)
Potassium: 3.8 mmol/L (ref 3.5–5.1)
Sodium: 135 mmol/L (ref 135–145)
Total Bilirubin: 0.7 mg/dL (ref 0.3–1.2)
Total Protein: 7.5 g/dL (ref 6.5–8.1)

## 2018-12-23 MED ORDER — PREDNISONE 20 MG PO TABS: 40.0000 mg | ORAL_TABLET | Freq: Every day | ORAL | 0 refills | Status: DC

## 2018-12-23 MED ORDER — FAMOTIDINE IN NACL 20-0.9 MG/50ML-% IV SOLN
20.0000 mg | Freq: Once | INTRAVENOUS | Status: AC
  Administered 2018-12-23: 20 mg via INTRAVENOUS
  Filled 2018-12-23: qty 50

## 2018-12-23 MED ORDER — FAMOTIDINE 40 MG PO TABS: 40.0000 mg | ORAL_TABLET | Freq: Every day | ORAL | 0 refills | Status: DC

## 2018-12-23 MED ORDER — METHYLPREDNISOLONE SODIUM SUCC 125 MG IJ SOLR
125.0000 mg | Freq: Once | INTRAMUSCULAR | Status: AC
  Administered 2018-12-23: 02:00:00 125 mg via INTRAVENOUS
  Filled 2018-12-23: qty 2

## 2018-12-23 MED ORDER — DIPHENHYDRAMINE HCL 50 MG/ML IJ SOLN
50.0000 mg | Freq: Once | INTRAMUSCULAR | Status: AC
  Administered 2018-12-23: 02:00:00 50 mg via INTRAVENOUS
  Filled 2018-12-23: qty 1

## 2018-12-23 MED ORDER — DIPHENHYDRAMINE HCL 25 MG PO TABS: 25.0000 mg | ORAL_TABLET | Freq: Four times a day (QID) | ORAL | 0 refills | Status: DC | PRN

## 2018-12-23 NOTE — ED Provider Notes (Signed)
Patient seen in conjunction with Pati Gallo, PA-C.  Patient presents to the emergency department with 2 complaints.  He complains of lip swelling that started a couple of hours ago.  He has significant swelling to his lower and upper lip.  He has no tongue swelling.  No stridor.  No airway compromise.  Will give Solu-Medrol, Pepcid, Benadryl, and reassess.  He is not on any ACE or ARBs.  He also complains of left flank/low back/hip pain.  CT shows no evidence of kidney stone.  Abdomen is soft.  Pain is reproducible with range of motion left hip.  Recommend Ortho follow-up.  5:18 AM Swelling of lips is improving.  Lower lip looks to almost be back to baseline.  Upper lip still moderately swollen, but improved.  No tongue swelling.  No stridor.  Normal phonation.  Plan for discharge with Benadryl, Pepcid, and prednisone.  Return precautions given.       Montine Circle, PA-C 24/58/09 9833    Delora Fuel, MD 82/50/53 [phone]

## 2018-12-23 NOTE — ED Provider Notes (Signed)
[facility] EMERGENCY DEPARTMENT Provider Note   CSN: [id number] Arrival date & time: 12/22/18  2323     History   Chief Complaint Chief Complaint  Patient presents with  . Flank Pain  . Angioedema    lower lips    HPI Don Martin is a 43 y.o. male.  With no pertinent past medical history presents for lip swelling and left sided back pain.  Patient states he noticed both issues approximately 10 PM last night when he got up from a nap and walked to the restroom.  Patient states that back pain initially started as a "pinch" and progressed to 10 out of 10 pain.  Patient states the pain radiates into his flank from his back.  Patient states the pain is worse with standing better at rest denies any recent trauma, heavy lifting, or unusual physical activities.       HPI  Past Medical History:  Diagnosis Date  . Gout     There are no active problems to display for this patient.   Past Surgical History:  Procedure Laterality Date  . JOINT REPLACEMENT     knee right  . KNEE SURGERY     RT knee surgery        Home Medications    Prior to Admission medications   Medication Sig Start Date End Date Taking? Authorizing Provider  ibuprofen (ADVIL) 800 MG tablet Take 1 tablet (800 mg total) by mouth 3 (three) times daily. 10/20/18   Hedges, Tinnie Gens, PA-C  methocarbamol (ROBAXIN) 500 MG tablet Take 1 tablet (500 mg total) by mouth 2 (two) times daily. 01/11/17   Dartha Lodge, PA-C  oxyCODONE-acetaminophen (PERCOCET/ROXICET) 5-325 MG tablet Take 1 tablet by mouth every 6 (six) hours as needed. 10/20/18   Hedges, Tinnie Gens, PA-C  traMADol (ULTRAM) 50 MG tablet TK 1 TO 2 TS PO Q 8 TO 12 H PRN P 03/20/16   [provider]    Family History No family history on file.  Social History Social History   Tobacco Use  . Smoking status: Never Smoker  . Smokeless tobacco: Never Used  Substance Use Topics  . Alcohol use: Yes    Comment: occasional  . Drug  use: No     Allergies   Shrimp [shellfish allergy]   Review of Systems Review of Systems  Constitutional: Negative for fever.  HENT: Negative for congestion, sinus pressure and sinus pain.   Respiratory: Negative for cough, chest tightness, shortness of breath, wheezing and stridor.   Cardiovascular: Negative for chest pain and leg swelling.  Gastrointestinal: Positive for abdominal pain and diarrhea. Negative for nausea and vomiting.  Endocrine: Negative for polyuria.  Genitourinary: Positive for flank pain. Negative for dysuria.  Musculoskeletal: Negative for myalgias.  Skin: Negative for rash.  Neurological: Negative for dizziness and headaches.     Physical Exam Updated Vital Signs BP 129/88   Pulse 79   Temp 99.8 F (37.7 C) (Oral)   Resp 18   SpO2 99%   Physical Exam Vitals signs and nursing note reviewed.  Constitutional:      Appearance: He is not ill-appearing.  HENT:     Head: Normocephalic and atraumatic.  Eyes:     General: No scleral icterus. Neck:     Musculoskeletal: No neck rigidity.  Cardiovascular:     Rate and Rhythm: Normal rate and regular rhythm.     Pulses: Normal pulses.     Heart sounds: Normal heart sounds.  Pulmonary:     Effort: Pulmonary effort is normal.     Breath sounds: Normal breath sounds.  Abdominal:     General: Abdomen is flat. Bowel sounds are normal. There is no distension.     Palpations: Abdomen is soft.     Tenderness: There is abdominal tenderness (Tenderness the left flank and right lower quadrant). There is no right CVA tenderness, left CVA tenderness, guarding or rebound.  Musculoskeletal:        General: Tenderness present.     Right lower leg: No edema.     Left lower leg: No edema.     Comments: Tenderness to palpation over the left lumbar paraspinal muscle.  Rest to the left lateral abdomen.  Patient has complete range of motion in ankles, knees, and hips.  However passive ROM of left hip causes reproducible  back pain during internal and external rotation.  Abduction of hip against resistance elicits reproducible pain.  Patient is able to flex, extend and adduct left hip without pain.  Skin:    General: Skin is warm and dry.     Capillary Refill: Capillary refill takes less than 2 seconds.  Neurological:     Mental Status: He is alert. Mental status is at baseline.  Psychiatric:        Behavior: Behavior normal.      ED Treatments / Results  Labs (all labs ordered are listed, but only abnormal results are displayed) Labs Reviewed  URINALYSIS, ROUTINE W REFLEX MICROSCOPIC    EKG None  Radiology Ct Renal Stone Study  Result Date: 12/23/2018 CLINICAL DATA:  Left abdominal pain EXAM: CT ABDOMEN AND PELVIS WITHOUT CONTRAST TECHNIQUE: Multidetector CT imaging of the abdomen and pelvis was performed following the standard protocol without IV contrast. COMPARISON:  None. FINDINGS: Lower chest: Lung bases are clear. No effusions. Heart is normal size. Hepatobiliary: Diffuse low-density throughout the liver compatible with fatty infiltration. No focal abnormality. Gallbladder unremarkable. Pancreas: No focal abnormality or ductal dilatation. Spleen: No focal abnormality.  Normal size. Adrenals/Urinary Tract: No adrenal abnormality. No focal renal abnormality. No stones or hydronephrosis. Urinary bladder is unremarkable. Stomach/Bowel: Stomach, large and small bowel grossly unremarkable. Normal appendix. Vascular/Lymphatic: No evidence of aneurysm or adenopathy. Reproductive: No visible focal abnormality. Other: No free fluid or free air. Musculoskeletal: No acute bony abnormality. IMPRESSION: Fatty infiltration of the liver. No renal or ureteral stones. No acute findings. Electronically Signed   By: Rolm Baptise M.D.   On: 12/23/2018 02:41    Procedures Procedures (including critical care time)  Medications Ordered in ED Medications  diphenhydrAMINE (BENADRYL) injection 50 mg (50 mg Intravenous  Given 12/23/18 0205)  methylPREDNISolone sodium succinate (SOLU-MEDROL) 125 mg/2 mL injection 125 mg (125 mg Intravenous Given 12/23/18 0205)  famotidine (PEPCID) IVPB 20 mg premix (20 mg Intravenous New Bag/Given 12/23/18 0205)     Initial Impression / Assessment and Plan / ED Course  I have reviewed the triage vital signs and the nursing notes.  Pertinent labs & imaging results that were available during my care of the patient were reviewed by me and considered in my medical decision making (see chart for details).        She has healthy 43 year old male presenting with injury to lower and upper lip and left flank pain began 10 PM yesterday after he woke up from a nap.  Patient states that both with pain and flank pain have progressively worsened since onset.  Patient initially had elevated blood pressure.  Patient given Benadryl, Solu-Medrol, famotidine and frequently assessed for airway compromise during ED visit.  This patient presents with abdominal pain of unclear etiology. A CT scan was performed to evaluate for potential causes of the abdominal pain, however, neither the clinical exam nor the CT has identified an emergent etiology for the abdominal pain. Specifically, given the benign exam, the laboratory studies, and unremarkable CT, I have a very low suspicion for appendicitis, ischemic bowel, bowel perforation, or any other life threatening disease. I have discussed with the patient the level of uncertainty with undifferentiated abdominal pain and clearly explained the need to follow-up as noted on the discharge instructions, or return to the Emergency Department immediately if the pain worsens, develops fever, persistent and uncontrollable vomiting, or for any new symptoms or concerns.  Patient is not on ACE inhibitor and denies any family history of angioedema.  Patient observed for 6 hours during ED visit without evidence of respiratory compromise.  Patient has no difficulty breathing  during this time.  Discharged home with Benadryl, Pepcid and prednisone.   Patient has stable vital signs at time of discharge, understands return precautions and plan, is able to manage secretions and tolerate p.o.    Final Clinical Impressions(s) / ED Diagnoses   Final diagnoses:  None    ED Discharge Orders    None       Gailen ShelterFondaw, Ashely Goosby S, [place] 12/23/18 [id number]    Dione BoozeGlick, David, MD 12/23/18 [id number]

## 2018-12-23 NOTE — ED Notes (Signed)
Pt reports new swelling to lips. Charge RN aware.

## 2018-12-23 NOTE — Discharge Instructions (Signed)
Please monitor your symptoms and return for worsening or new symptoms.  If you have any shortness of breath, fevers, difficulty breathing or other concerns please return to ED. Please read attached information about low back sprains and about angioedema.

## 2018-12-23 NOTE — ED Notes (Signed)
Patient transported to CT 

## 2019-05-30 ENCOUNTER — Emergency Department (HOSPITAL_COMMUNITY)
Admission: EM | Admit: 2019-05-30 | Discharge: 2019-05-31 | Disposition: A | Discharge status: Home / Self Care | Payer: Medicaid Other | Attending: Emergency Medicine | Admitting: Emergency Medicine

## 2019-05-30 ENCOUNTER — Emergency Department (HOSPITAL_COMMUNITY): Payer: Medicaid Other

## 2019-05-30 ENCOUNTER — Encounter (HOSPITAL_COMMUNITY): Payer: Self-pay | Admitting: Emergency Medicine

## 2019-05-30 DIAGNOSIS — Z96651 Presence of right artificial knee joint: Secondary | ICD-10-CM | POA: Insufficient documentation

## 2019-05-30 DIAGNOSIS — Z79899 Other long term (current) drug therapy: Secondary | ICD-10-CM | POA: Diagnosis not present

## 2019-05-30 DIAGNOSIS — R079 Chest pain, unspecified: Secondary | ICD-10-CM | POA: Diagnosis not present

## 2019-05-30 DIAGNOSIS — R071 Chest pain on breathing: Secondary | ICD-10-CM | POA: Diagnosis present

## 2019-05-30 LAB — CBC
HCT: 47.5 % (ref 39.0–52.0)
Hemoglobin: 15.8 g/dL (ref 13.0–17.0)
MCH: 29.4 pg (ref 26.0–34.0)
MCHC: 33.3 g/dL (ref 30.0–36.0)
MCV: 88.3 fL (ref 80.0–100.0)
Platelets: 283 10*3/uL (ref 150–400)
RBC: 5.38 MIL/uL (ref 4.22–5.81)
RDW: 13.3 % (ref 11.5–15.5)
WBC: 9.5 10*3/uL (ref 4.0–10.5)
nRBC: 0 % (ref 0.0–0.2)

## 2019-05-30 LAB — BASIC METABOLIC PANEL
Anion gap: 10 (ref 5–15)
BUN: 10 mg/dL (ref 6–20)
CO2: 24 mmol/L (ref 22–32)
Calcium: 9.5 mg/dL (ref 8.9–10.3)
Chloride: 105 mmol/L (ref 98–111)
Creatinine, Ser: 0.97 mg/dL (ref 0.61–1.24)
GFR calc Af Amer: >60 mL/min (ref >60)
GFR calc non Af Amer: >60 mL/min (ref >60)
Glucose, Bld: 151 mg/dL — ABNORMAL HIGH (ref 70–99)
Potassium: 3.6 mmol/L (ref 3.5–5.1)
Sodium: 139 mmol/L (ref 135–145)

## 2019-05-30 LAB — TROPONIN I (HIGH SENSITIVITY): Troponin I (High Sensitivity): <2 ng/L (ref <18)

## 2019-05-30 MED ORDER — SODIUM CHLORIDE 0.9% FLUSH: 3.0000 mL | Freq: Once | INTRAVENOUS | Status: DC

## 2019-05-30 NOTE — ED Triage Notes (Signed)
Pt c/o L chest pain onset today after eating rice, pt reports dizziness and nausea. Pain worse with deep breath.

## 2019-05-31 ENCOUNTER — Other Ambulatory Visit: Payer: Self-pay

## 2019-05-31 LAB — D-DIMER, QUANTITATIVE: D-Dimer, Quant: <0.27 ug/mL-FEU (ref 0.00–0.50)

## 2019-05-31 LAB — TROPONIN I (HIGH SENSITIVITY): Troponin I (High Sensitivity): 3 ng/L (ref <18)

## 2019-05-31 MED ORDER — ONDANSETRON HCL 4 MG/2ML IJ SOLN
INTRAMUSCULAR | Status: AC
  Filled 2019-05-31: qty 2

## 2019-05-31 MED ORDER — ALUM & MAG HYDROXIDE-SIMETH 200-200-20 MG/5ML PO SUSP
30.0000 mL | Freq: Once | ORAL | Status: AC
  Administered 2019-05-31: 30 mL via ORAL
  Filled 2019-05-31: qty 30

## 2019-05-31 MED ORDER — LIDOCAINE VISCOUS HCL 2 % MT SOLN
15.0000 mL | Freq: Once | OROMUCOSAL | Status: AC
  Administered 2019-05-31: 15 mL via ORAL
  Filled 2019-05-31: qty 15

## 2019-05-31 NOTE — ED Provider Notes (Signed)
[facility] EMERGENCY DEPARTMENT Provider Note   CSN: [id number] Arrival date & time: 05/30/19  2140     History Chief Complaint  Patient presents with  . Chest Pain    Don Martin is a 44 y.o. male.  44 year old male with history of gout presents to the emergency department for evaluation of chest pain.  Chest pain began around 1430 today and has been constant.  He describes a tightening sensation that feels as though it "spreads out" when he breathes.  Breathing aggravates his discomfort and he has not had any symptomatic relief following the use of Pepto-Bismol.  No aggravation with ambulation or exertion.  Reports some associated dizziness and nausea.  Denies any radiation of his pain, diaphoresis, syncope, leg swelling, hemoptysis, fever, jaw pain, back pain, abdominal pain.  Patient is a nonsmoker.  Denies PMH of HTN, CAD, DM, HLD, ACS, VTE.  No known FHx of ACS or DVT/PE.  The history is provided by the patient. No language interpreter was used.  Chest Pain      Past Medical History:  Diagnosis Date  . Gout     There are no problems to display for this patient.   Past Surgical History:  Procedure Laterality Date  . JOINT REPLACEMENT     knee right  . KNEE SURGERY     RT knee surgery       No family history on file.  Social History   Tobacco Use  . Smoking status: Never Smoker  . Smokeless tobacco: Never Used  Substance Use Topics  . Alcohol use: Yes    Comment: occasional  . Drug use: No    Home Medications Prior to Admission medications   Medication Sig Start Date End Date Taking? Authorizing Provider  diphenhydrAMINE (BENADRYL) 25 MG tablet Take 1 tablet (25 mg total) by mouth every 6 (six) hours as needed. 12/23/18   Gailen Shelter, PA  famotidine (PEPCID) 40 MG tablet Take 1 tablet (40 mg total) by mouth daily. 12/23/18   Gailen Shelter, PA  ibuprofen (ADVIL) 800 MG tablet Take 1 tablet (800 mg total) by mouth 3 (three) times  daily. Patient not taking: Reported on 12/23/2018 10/20/18   Hedges, Tinnie Gens, PA-C  methocarbamol (ROBAXIN) 500 MG tablet Take 1 tablet (500 mg total) by mouth 2 (two) times daily. Patient not taking: Reported on 12/23/2018 01/11/17   Dartha Lodge, PA-C  oxyCODONE-acetaminophen (PERCOCET/ROXICET) 5-325 MG tablet Take 1 tablet by mouth every 6 (six) hours as needed. Patient not taking: Reported on 12/23/2018 10/20/18   Hedges, Tinnie Gens, PA-C  predniSONE (DELTASONE) 20 MG tablet Take 2 tablets (40 mg total) by mouth daily with breakfast. 12/23/18   Gailen Shelter, PA    Allergies    Shrimp [shellfish allergy]  Review of Systems   Review of Systems  Cardiovascular: Positive for chest pain.  Ten systems reviewed and are negative for acute change, except as noted in the HPI.    Physical Exam Updated Vital Signs BP 123/88   Pulse (!) 53   Temp 98.9 F (37.2 C) (Oral)   Resp 13   Ht 6\' 2"  (1.88 m)   Wt 108.9 kg   SpO2 100%   BMI 30.81 kg/m   Physical Exam Vitals and nursing note reviewed.  Constitutional:      General: He is not in acute distress.    Appearance: He is well-developed. He is not diaphoretic.     Comments: Nontoxic appearing and in  NAD  HENT:     Head: Normocephalic and atraumatic.  Eyes:     General: No scleral icterus.    Conjunctiva/sclera: Conjunctivae normal.  Cardiovascular:     Rate and Rhythm: Normal rate and regular rhythm.     Pulses: Normal pulses.  Pulmonary:     Effort: Pulmonary effort is normal. No respiratory distress.     Breath sounds: No stridor. No wheezing or rhonchi.     Comments: Lungs CTAB. Respirations even and unlabored. Mild chest TTP to the left of sternum without crepitus or deformity. Chest:     Chest wall: Tenderness present.  Musculoskeletal:        General: Normal range of motion.     Cervical back: Normal range of motion.     Comments: No BLE edema  Skin:    General: Skin is warm and dry.     Coloration: Skin is not pale.      Findings: No erythema or rash.  Neurological:     Mental Status: He is alert and oriented to person, place, and time.     Coordination: Coordination normal.  Psychiatric:        Behavior: Behavior normal.     ED Results / Procedures / Treatments   Labs (all labs ordered are listed, but only abnormal results are displayed) Labs Reviewed  BASIC METABOLIC PANEL - Abnormal; Notable for the following components:      Result Value   Glucose, Bld 151 (*)    All other components within normal limits  CBC  D-DIMER, QUANTITATIVE (NOT AT [facility])  TROPONIN I (HIGH SENSITIVITY)  TROPONIN I (HIGH SENSITIVITY)    EKG EKG Interpretation  Date/Time:  Sunday May 30 2019 21:54:14 EST Ventricular Rate:  68 PR Interval:  174 QRS Duration: 84 QT Interval:  378 QTC Calculation: 401 R Axis:   57 Text Interpretation: Normal sinus rhythm Nonspecific T wave abnormality Abnormal ECG No significant change since last tracing Confirmed by Rochele Raring [phone]) on 05/31/2019 2:30:09 AM   Radiology DG Chest 2 View  Result Date: 05/30/2019 CLINICAL DATA:  Chest pain EXAM: CHEST - 2 VIEW COMPARISON:  CT 01/11/2017 FINDINGS: The heart size and mediastinal contours are within normal limits. Both lungs are clear. The visualized skeletal structures are unremarkable. IMPRESSION: No active cardiopulmonary disease. Electronically Signed   By: Jasmine Pang M.D.   On: 05/30/2019 22:16    Procedures Procedures (including critical care time)  Medications Ordered in ED Medications  sodium chloride flush (NS) 0.9 % injection 3 mL (3 mLs Intravenous Not Given 05/31/19 0137)  ondansetron (ZOFRAN) 4 MG/2ML injection (  Not Given 05/31/19 0244)  alum & mag hydroxide-simeth (MAALOX/MYLANTA) 200-200-20 MG/5ML suspension 30 mL (30 mLs Oral Given 05/31/19 0140)    And  lidocaine (XYLOCAINE) 2 % viscous mouth solution 15 mL (15 mLs Oral Given 05/31/19 0140)    ED Course  I have reviewed the triage vital signs and the  nursing notes.  Pertinent labs & imaging results that were available during my care of the patient were reviewed by me and considered in my medical decision making (see chart for details).    MDM Rules/Calculators/A&P                      Patient presents to the emergency department for evaluation of chest pain.  EKG without acute ischemic change and troponin negative x2.  Chest x-ray without evidence of mediastinal widening to suggest dissection.  No  pneumothorax, pneumonia, pleural effusion.  Pulmonary embolus further considered; however, patient without tachycardia, tachypnea, dyspnea, hypoxia.  D dimer negative today.  Patient did have improvement in his chest pain down to 2/10 with a GI cocktail.  He may be experiencing a degree of gastritis versus esophageal spasm, though musculoskeletal etiology remains on the differential given that pain is reproducible on palpation of the chest wall.  We will continue with outpatient supportive care.  Have encouraged follow-up with his primary care doctor.  Return precautions discussed and provided. Patient discharged in stable condition with no unaddressed concerns.   Final Clinical Impression(s) / ED Diagnoses Final diagnoses:  Nonspecific chest pain    Rx / DC Orders ED Discharge Orders    None       Antonietta Breach, PA-C 05/31/19 0308    Ward, Delice Bison, DO 05/31/19 [phone]

## 2019-05-31 NOTE — ED Notes (Signed)
Discharge instructions discussed with pt. Pt verbalized understanding with no questions at this time. Pt to follow up with PCP.  

## 2019-05-31 NOTE — Discharge Instructions (Addendum)
Your work-up in the ED was reassuring and did not reveal a concerning cause of your symptoms.  We believe that your pain may be due to a muscle strain or spasm in your chest. You may use Tylenol or ibuprofen for ongoing discomfort. Follow up with a primary care doctor. Have a primary care doctor recheck your blood pressure within the week as well.

## 2019-10-03 ENCOUNTER — Emergency Department (HOSPITAL_COMMUNITY)
Admission: EM | Admit: 2019-10-03 | Discharge: 2019-10-04 | Disposition: A | Discharge status: Home / Self Care | Payer: Medicaid Other | Attending: Emergency Medicine | Admitting: Emergency Medicine

## 2019-10-03 ENCOUNTER — Other Ambulatory Visit: Payer: Self-pay

## 2019-10-03 ENCOUNTER — Emergency Department (HOSPITAL_COMMUNITY): Payer: Medicaid Other

## 2019-10-03 ENCOUNTER — Encounter (HOSPITAL_COMMUNITY): Payer: Self-pay | Admitting: Emergency Medicine

## 2019-10-03 DIAGNOSIS — R519 Headache, unspecified: Secondary | ICD-10-CM | POA: Insufficient documentation

## 2019-10-03 DIAGNOSIS — R1013 Epigastric pain: Secondary | ICD-10-CM | POA: Diagnosis not present

## 2019-10-03 DIAGNOSIS — R079 Chest pain, unspecified: Secondary | ICD-10-CM | POA: Insufficient documentation

## 2019-10-03 DIAGNOSIS — E1165 Type 2 diabetes mellitus with hyperglycemia: Secondary | ICD-10-CM | POA: Diagnosis not present

## 2019-10-03 DIAGNOSIS — R739 Hyperglycemia, unspecified: Secondary | ICD-10-CM

## 2019-10-03 HISTORY — DX: Type 2 diabetes mellitus without complications: E11.9

## 2019-10-03 LAB — BASIC METABOLIC PANEL
Anion gap: 11 (ref 5–15)
BUN: 11 mg/dL (ref 6–20)
CO2: 20 mmol/L — ABNORMAL LOW (ref 22–32)
Calcium: 9.4 mg/dL (ref 8.9–10.3)
Chloride: 101 mmol/L (ref 98–111)
Creatinine, Ser: 0.92 mg/dL (ref 0.61–1.24)
GFR calc Af Amer: >60 mL/min (ref >60)
GFR calc non Af Amer: >60 mL/min (ref >60)
Glucose, Bld: 380 mg/dL — ABNORMAL HIGH (ref 70–99)
Potassium: 4.1 mmol/L (ref 3.5–5.1)
Sodium: 132 mmol/L — ABNORMAL LOW (ref 135–145)

## 2019-10-03 LAB — CBC
HCT: 43.1 % (ref 39.0–52.0)
Hemoglobin: 14.6 g/dL (ref 13.0–17.0)
MCH: 29.2 pg (ref 26.0–34.0)
MCHC: 33.9 g/dL (ref 30.0–36.0)
MCV: 86.2 fL (ref 80.0–100.0)
Platelets: 273 10*3/uL (ref 150–400)
RBC: 5 MIL/uL (ref 4.22–5.81)
RDW: 12.6 % (ref 11.5–15.5)
WBC: 9.5 10*3/uL (ref 4.0–10.5)
nRBC: 0 % (ref 0.0–0.2)

## 2019-10-03 LAB — LIPASE, BLOOD: Lipase: 31 U/L (ref 11–51)

## 2019-10-03 LAB — TROPONIN I (HIGH SENSITIVITY): Troponin I (High Sensitivity): <2 ng/L (ref <18)

## 2019-10-03 MED ORDER — SODIUM CHLORIDE 0.9% FLUSH
3.0000 mL | Freq: Once | INTRAVENOUS | Status: AC
  Administered 2019-10-04: 3 mL via INTRAVENOUS

## 2019-10-03 NOTE — ED Triage Notes (Signed)
Patient reports central chest pain this evening with epigastric pain , bilateral eyes burning and elevated blood sugar ( 463) at home this evening . Denies SOB , no emesis or diaphoresis .

## 2019-10-04 LAB — HEPATIC FUNCTION PANEL
ALT: 41 U/L (ref 0–44)
AST: 39 U/L (ref 15–41)
Albumin: 3.6 g/dL (ref 3.5–5.0)
Alkaline Phosphatase: 105 U/L (ref 38–126)
Bilirubin, Direct: 0.2 mg/dL (ref 0.0–0.2)
Indirect Bilirubin: 0.6 mg/dL (ref 0.3–0.9)
Total Bilirubin: 0.8 mg/dL (ref 0.3–1.2)
Total Protein: 6.9 g/dL (ref 6.5–8.1)

## 2019-10-04 LAB — URINALYSIS, ROUTINE W REFLEX MICROSCOPIC
Bacteria, UA: NONE SEEN
Bilirubin Urine: NEGATIVE
Glucose, UA: >=500 mg/dL — AB
Hgb urine dipstick: NEGATIVE
Ketones, ur: 5 mg/dL — AB
Leukocytes,Ua: NEGATIVE
Nitrite: NEGATIVE
Protein, ur: NEGATIVE mg/dL
Specific Gravity, Urine: 1.025 (ref 1.005–1.030)
pH: 6 (ref 5.0–8.0)

## 2019-10-04 LAB — CBG MONITORING, ED: Glucose-Capillary: 357 mg/dL — ABNORMAL HIGH (ref 70–99)

## 2019-10-04 LAB — TROPONIN I (HIGH SENSITIVITY): Troponin I (High Sensitivity): 3 ng/L (ref <18)

## 2019-10-04 MED ORDER — SODIUM CHLORIDE 0.9 % IV BOLUS
1000.0000 mL | Freq: Once | INTRAVENOUS | Status: AC
  Administered 2019-10-04: 1000 mL via INTRAVENOUS

## 2019-10-04 NOTE — ED Provider Notes (Signed)
[facility] EMERGENCY DEPARTMENT Provider Note   CSN: [id number] Arrival date & time: 10/03/19  2048     History Chief Complaint  Patient presents with   Chest Pain   Hyperglycemia    Don Martin is a 44 y.o. male.  HPI Patient with history of DM recently diagnosed and started on Lantus 10U SQ daily by his PCP last week increased his dose to 12U over the weekend without much improvement in his glucose. He reports last night around 7PM he began to have moderate aching epigastric and L lower chest pain, associated with headache and burning eyes, but no SOB, N/V or diaphoresis. He reports his glucose was elevated >400 at home. He presented to the ED late last night where initial EKG and Trop were neg. Symptoms resolved after about 2 hours. He has been pain free while in the ED awaiting an exam room overnight.     Past Medical History:  Diagnosis Date   Diabetes mellitus without complication (HCC)    Gout     There are no problems to display for this patient.   Past Surgical History:  Procedure Laterality Date   JOINT REPLACEMENT     knee right   KNEE SURGERY     RT knee surgery       No family history on file.  Social History   Tobacco Use   Smoking status: Never Smoker   Smokeless tobacco: Never Used  Substance Use Topics   Alcohol use: Yes    Comment: occasional   Drug use: No    Home Medications Prior to Admission medications   Medication Sig Start Date End Date Taking? Authorizing Provider  allopurinol (ZYLOPRIM) 300 MG tablet Take 300 mg by mouth daily. 06/22/19  Yes [provider]  famotidine (PEPCID) 40 MG tablet Take 1 tablet (40 mg total) by mouth daily. 12/23/18  Yes Fondaw, Wylder S, PA  insulin glargine (LANTUS) 100 UNIT/ML Solostar Pen Inject 10-12 Units into the skin daily. If reading is over 100, inject 12 units. Otherwise, injects 10 units everyday 09/28/19  Yes [provider]  diphenhydrAMINE  (BENADRYL) 25 MG tablet Take 1 tablet (25 mg total) by mouth every 6 (six) hours as needed. Patient not taking: Reported on 10/04/2019 12/23/18 10/04/19  Gailen Shelter, PA    Allergies    Shrimp [shellfish allergy]  Review of Systems   Review of Systems A comprehensive review of systems was completed and negative except as noted in HPI.   Physical Exam Updated Vital Signs BP (!) 133/97    Pulse (!) 53    Temp 98.5 F (36.9 C) (Oral)    Resp 11    Ht 6\' 1"  (1.854 m)    Wt 108 kg    SpO2 99%    BMI 31.41 kg/m   Physical Exam Vitals and nursing note reviewed.  Constitutional:      Appearance: Normal appearance.  HENT:     Head: Normocephalic and atraumatic.     Nose: Nose normal.     Mouth/Throat:     Mouth: Mucous membranes are moist.  Eyes:     Extraocular Movements: Extraocular movements intact.     Conjunctiva/sclera: Conjunctivae normal.  Cardiovascular:     Rate and Rhythm: Normal rate.  Pulmonary:     Effort: Pulmonary effort is normal.     Breath sounds: Normal breath sounds.  Abdominal:     General: Abdomen is flat.     Palpations: Abdomen  is soft.     Tenderness: There is abdominal tenderness (mild epigastric). There is no guarding.     Comments: Neg Murphy's sign  Musculoskeletal:        General: No swelling. Normal range of motion.     Cervical back: Neck supple.  Skin:    General: Skin is warm and dry.  Neurological:     General: No focal deficit present.     Mental Status: He is alert.  Psychiatric:        Mood and Affect: Mood normal.     ED Results / Procedures / Treatments   Labs (all labs ordered are listed, but only abnormal results are displayed) Labs Reviewed  BASIC METABOLIC PANEL - Abnormal; Notable for the following components:      Result Value   Sodium 132 (*)    CO2 20 (*)    Glucose, Bld 380 (*)    All other components within normal limits  CBG MONITORING, ED - Abnormal; Notable for the following components:   Glucose-Capillary  357 (*)    All other components within normal limits  CBC  LIPASE, BLOOD  HEPATIC FUNCTION PANEL  URINALYSIS, ROUTINE W REFLEX MICROSCOPIC  TROPONIN I (HIGH SENSITIVITY)  TROPONIN I (HIGH SENSITIVITY)  TROPONIN I (HIGH SENSITIVITY)    EKG EKG Interpretation  Date/Time:  Sunday October 03 2019 20:50:25 EDT Ventricular Rate:  85 PR Interval:  156 QRS Duration: 78 QT Interval:  344 QTC Calculation: 409 R Axis:   57 Text Interpretation: Normal sinus rhythm Normal ECG When compared with ECG of 05/30/2019, Nonspecific T wave abnormality has improved Confirmed by Delora Fuel ([id number]) on 10/04/2019 2:29:14 AM   Radiology DG Chest 2 View  Result Date: 10/03/2019 CLINICAL DATA:  44 year old male with chest pain. EXAM: CHEST - 2 VIEW COMPARISON:  Chest radiograph dated 05/30/2019. FINDINGS: The heart size and mediastinal contours are within normal limits. Both lungs are clear. The visualized skeletal structures are unremarkable. IMPRESSION: No active cardiopulmonary disease. Electronically Signed   By: Anner Crete M.D.   On: 10/03/2019 21:19    Procedures Procedures (including critical care time)  Medications Ordered in ED Medications  sodium chloride flush (NS) 0.9 % injection 3 mL (3 mLs Intravenous Given 10/04/19 0758)  sodium chloride 0.9 % bolus 1,000 mL (1,000 mLs Intravenous New Bag/Given 10/04/19 0753)    ED Course  I have reviewed the triage vital signs and the nursing notes.  Pertinent labs & imaging results that were available during my care of the patient were reviewed by me and considered in my medical decision making (see chart for details).  Clinical Course as of Oct 03 924  Mon Oct 04, 2019  0807 HEART Pathway is 1. Low risk. If second trop and LFTs unremarkable, anticipate discharge with PCP followup tomorrow for glucose management. No signs of DKA or other significant electrolyte abnormalities today.    [CS]  [id number] LFTs normal. Delta trop is also normal >12hrs  since symptom onset. Plan discharge as above when IVF complete.    [CS]    Clinical Course User Index [CS] Truddie Hidden, MD   MDM Rules/Calculators/A&P                          Patient with poorly controlled DM, just recently started therapy and is in the process of escalating Lantus doses, has a PCP appointment tomorrow. Initial Trop and EKG done on arrival are neg. BMP and  Lipase also neg. Will add LFTs and second Trop this AM.  Final Clinical Impression(s) / ED Diagnoses Final diagnoses:  Chest pain, unspecified type  Hyperglycemia    Rx / DC Orders ED Discharge Orders    None       Pollyann Savoy, MD 10/04/19 [phone]

## 2021-07-30 ENCOUNTER — Emergency Department (HOSPITAL_COMMUNITY)
Admission: EM | Admit: 2021-07-30 | Discharge: 2021-07-30 | Discharge status: Against Medical Advice | Payer: Medicaid Other

## 2021-09-28 IMAGING — DX DG CHEST 2V
2 series · 2 of 2 positions shown · non-contrast
Comparison: CT 01/11/2017

CLINICAL DATA: Chest pain

EXAM:
CHEST - 2 VIEW

[chest pa]
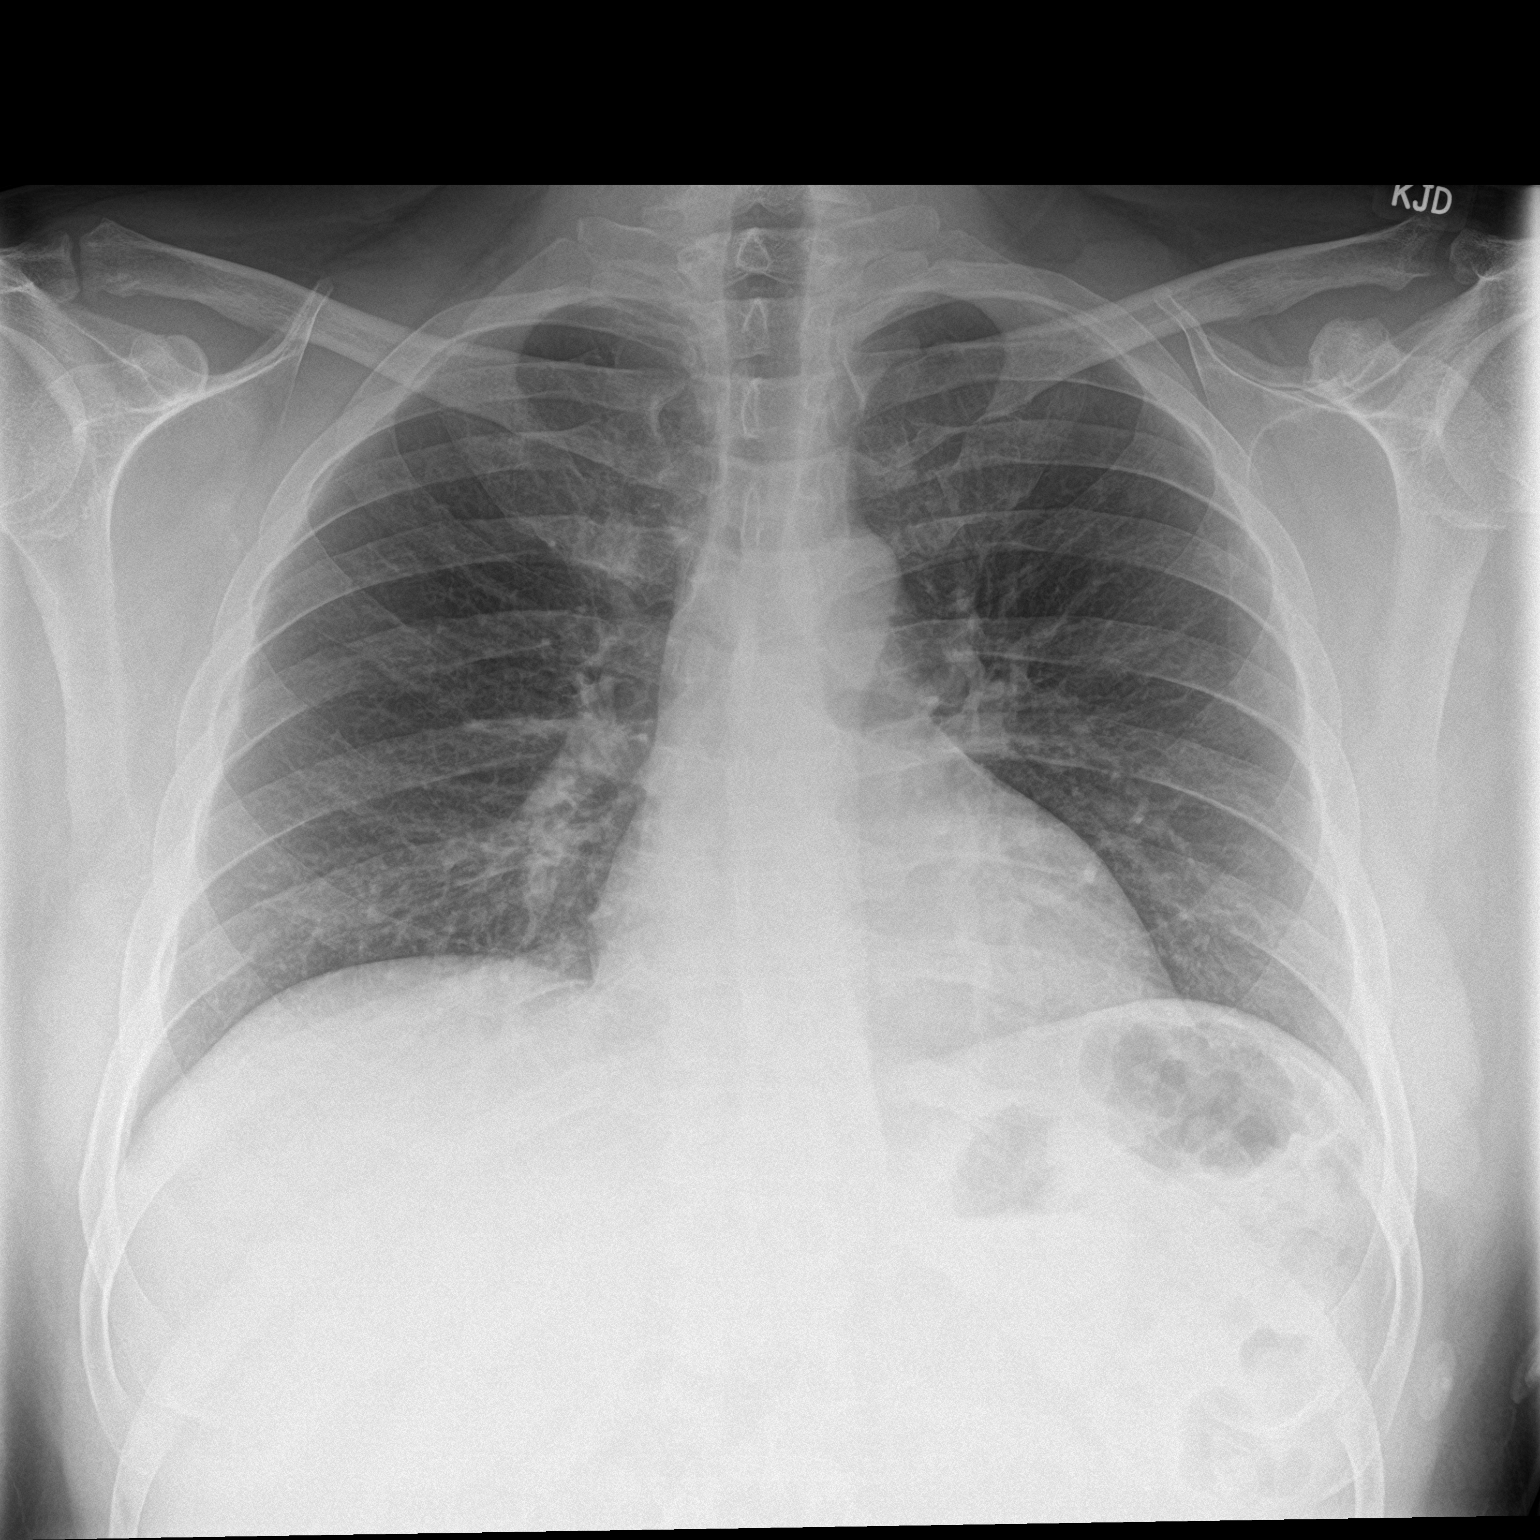

[chest lat]
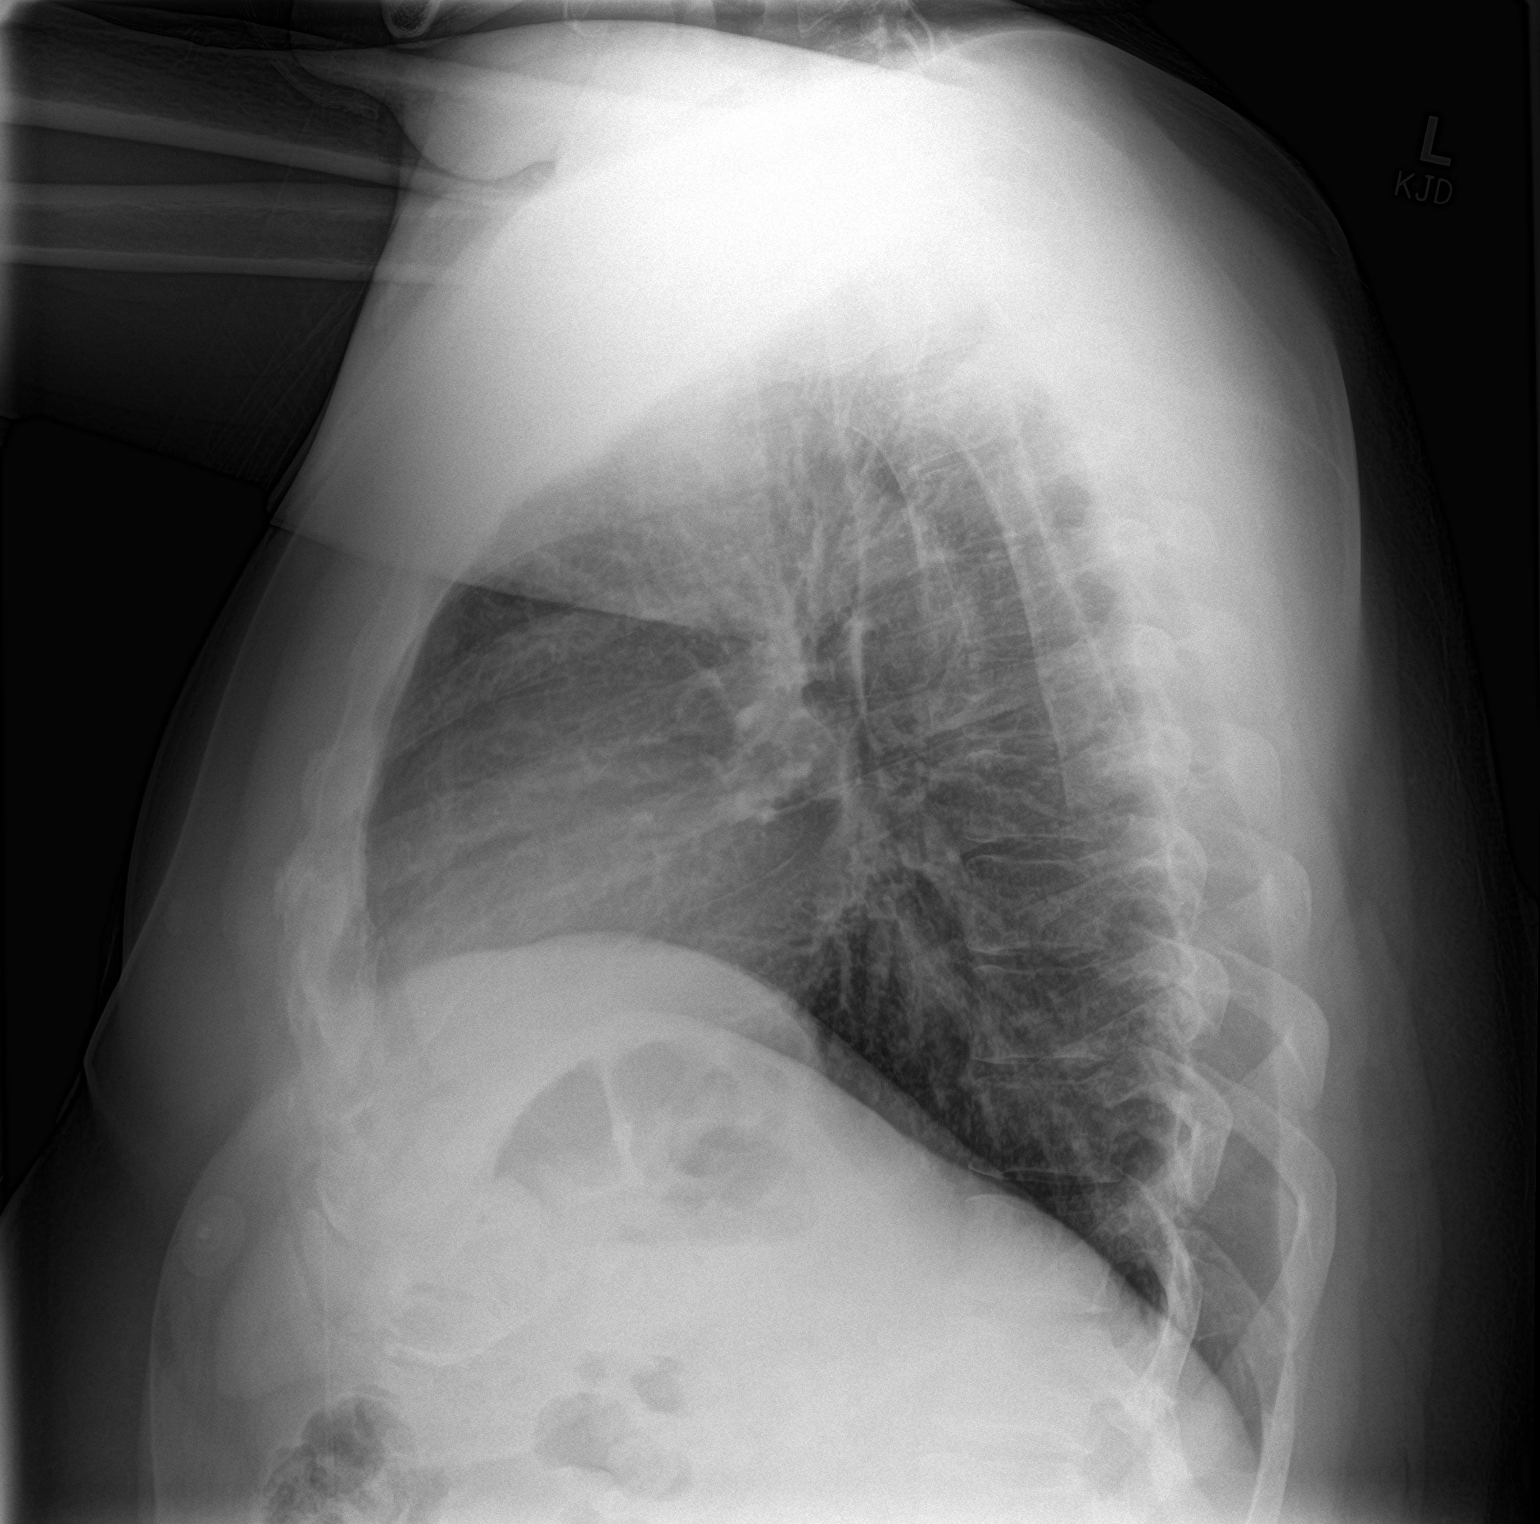

[2 of 2 positions shown; findings below may reference images not displayed]

FINDINGS: The heart size and mediastinal contours are within normal limits.
Both lungs are clear. The visualized skeletal structures are
unremarkable.
IMPRESSION: No active cardiopulmonary disease.

## 2022-02-01 IMAGING — DX DG CHEST 2V
2 series · 2 of 2 positions shown · non-contrast
Comparison: Chest radiograph dated 05/30/2019.

CLINICAL DATA: 43-year-old male with chest pain.

EXAM:
CHEST - 2 VIEW

[chest pa]
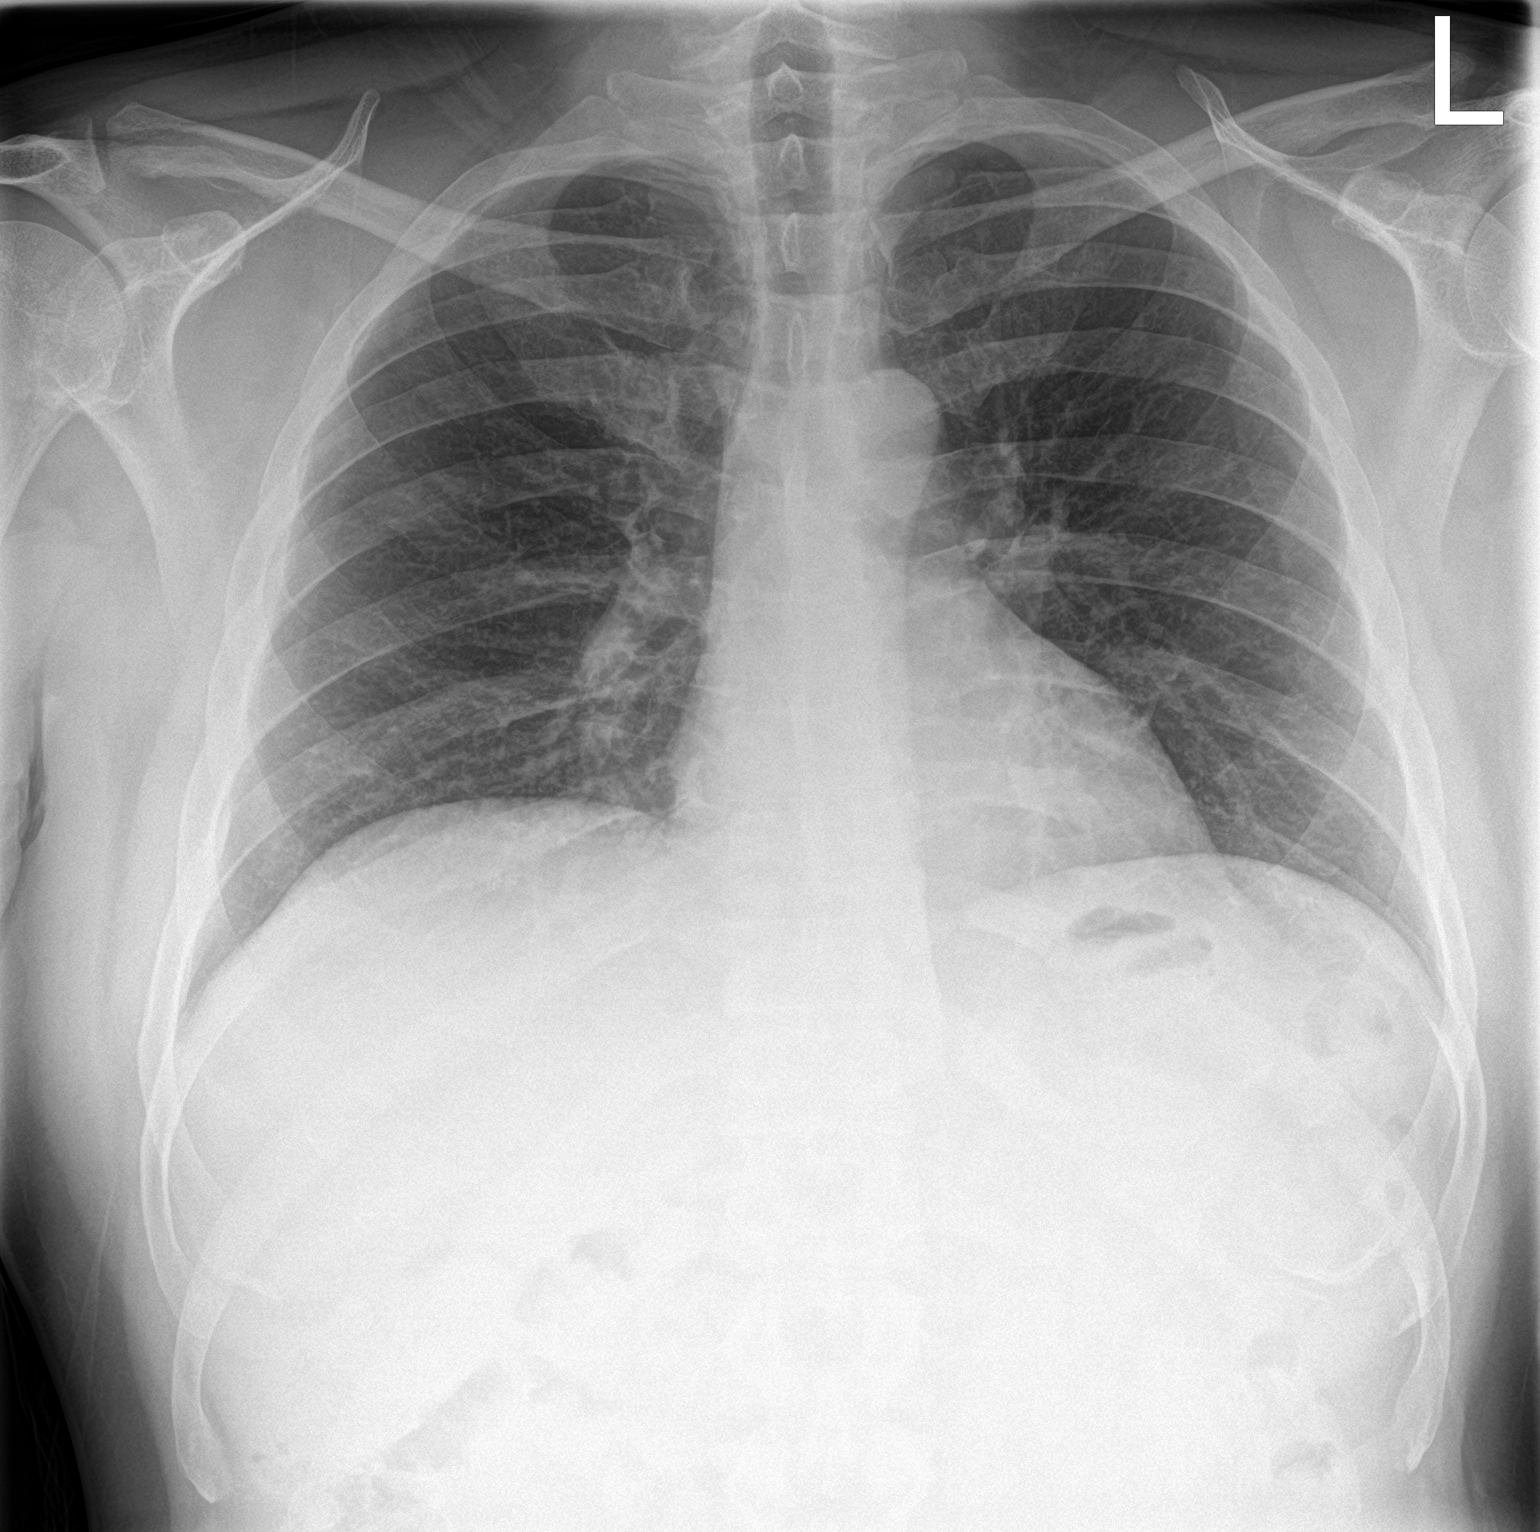

[chest lat]
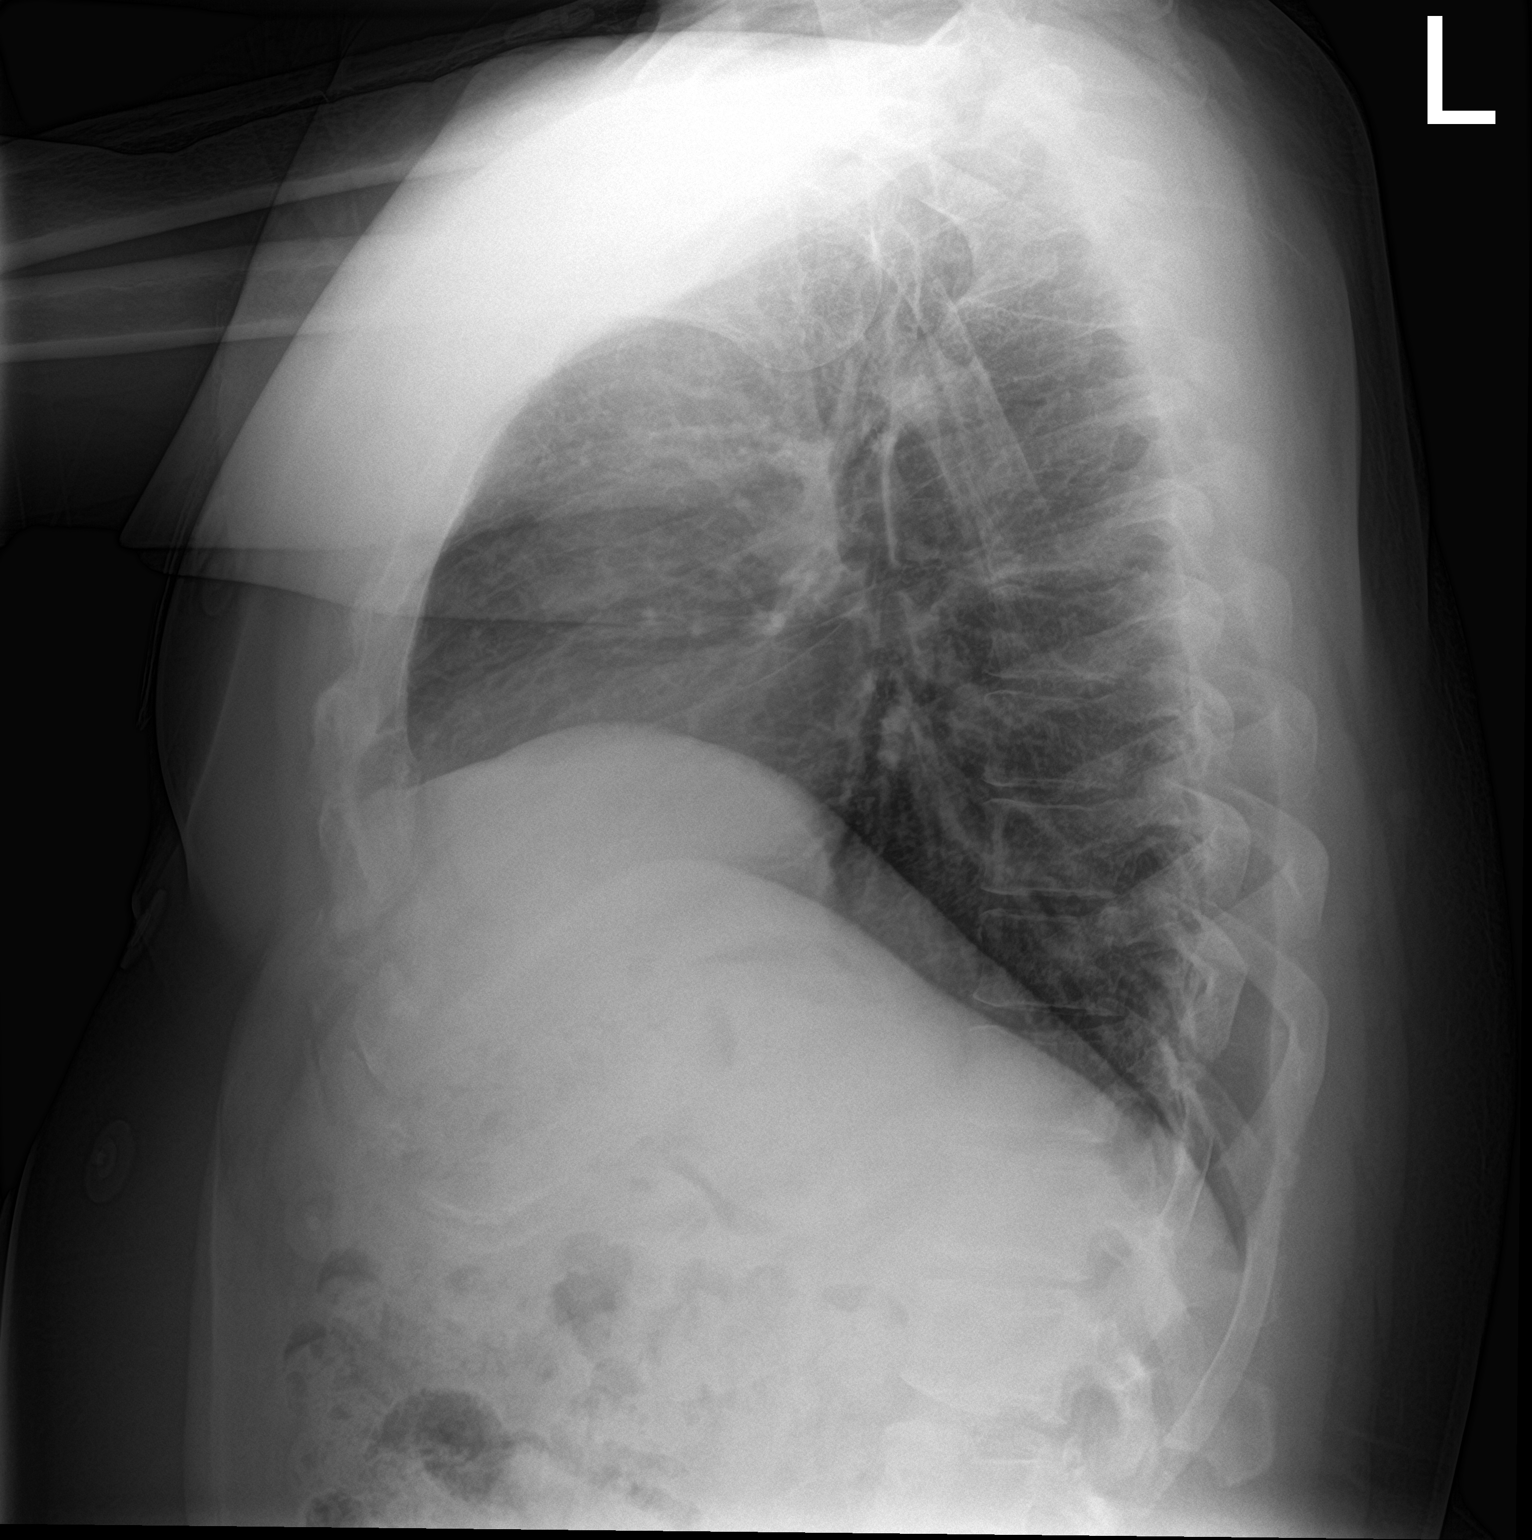

[2 of 2 positions shown; findings below may reference images not displayed]

FINDINGS: The heart size and mediastinal contours are within normal limits.
Both lungs are clear. The visualized skeletal structures are
unremarkable.
IMPRESSION: No active cardiopulmonary disease.

## 2023-05-10 ENCOUNTER — Emergency Department (HOSPITAL_COMMUNITY)
Admission: EM | Admit: 2023-05-10 | Discharge: 2023-05-10 | Disposition: A | Discharge status: Home / Self Care | Payer: Medicaid Other | Attending: Emergency Medicine | Admitting: Emergency Medicine

## 2023-05-10 ENCOUNTER — Other Ambulatory Visit: Payer: Self-pay

## 2023-05-10 ENCOUNTER — Emergency Department (HOSPITAL_COMMUNITY): Payer: Medicaid Other

## 2023-05-10 ENCOUNTER — Encounter (HOSPITAL_COMMUNITY): Payer: Self-pay

## 2023-05-10 DIAGNOSIS — R0789 Other chest pain: Secondary | ICD-10-CM | POA: Insufficient documentation

## 2023-05-10 DIAGNOSIS — D72829 Elevated white blood cell count, unspecified: Secondary | ICD-10-CM | POA: Insufficient documentation

## 2023-05-10 DIAGNOSIS — E1165 Type 2 diabetes mellitus with hyperglycemia: Secondary | ICD-10-CM | POA: Diagnosis not present

## 2023-05-10 DIAGNOSIS — Z794 Long term (current) use of insulin: Secondary | ICD-10-CM | POA: Insufficient documentation

## 2023-05-10 DIAGNOSIS — Z79899 Other long term (current) drug therapy: Secondary | ICD-10-CM | POA: Insufficient documentation

## 2023-05-10 DIAGNOSIS — R079 Chest pain, unspecified: Secondary | ICD-10-CM | POA: Diagnosis present

## 2023-05-10 HISTORY — DX: Unspecified osteoarthritis, unspecified site: M19.90

## 2023-05-10 LAB — BASIC METABOLIC PANEL
Anion gap: 15 (ref 5–15)
BUN: 11 mg/dL (ref 6–20)
CO2: 23 mmol/L (ref 22–32)
Calcium: 9.6 mg/dL (ref 8.9–10.3)
Chloride: 100 mmol/L (ref 98–111)
Creatinine, Ser: 1.01 mg/dL (ref 0.61–1.24)
GFR, Estimated: >60 mL/min (ref >60)
Glucose, Bld: 156 mg/dL — ABNORMAL HIGH (ref 70–99)
Potassium: 3.9 mmol/L (ref 3.5–5.1)
Sodium: 138 mmol/L (ref 135–145)

## 2023-05-10 LAB — CBC
HCT: 43 % (ref 39.0–52.0)
Hemoglobin: 14.5 g/dL (ref 13.0–17.0)
MCH: 29.6 pg (ref 26.0–34.0)
MCHC: 33.7 g/dL (ref 30.0–36.0)
MCV: 87.8 fL (ref 80.0–100.0)
Platelets: 267 10*3/uL (ref 150–400)
RBC: 4.9 MIL/uL (ref 4.22–5.81)
RDW: 13 % (ref 11.5–15.5)
WBC: 12 10*3/uL — ABNORMAL HIGH (ref 4.0–10.5)
nRBC: 0 % (ref 0.0–0.2)

## 2023-05-10 LAB — TROPONIN I (HIGH SENSITIVITY): Troponin I (High Sensitivity): 3 ng/L (ref <18)

## 2023-05-10 MED ORDER — KETOROLAC TROMETHAMINE 60 MG/2ML IM SOLN
30.0000 mg | Freq: Once | INTRAMUSCULAR | Status: AC
  Administered 2023-05-10: 30 mg via INTRAMUSCULAR
  Filled 2023-05-10: qty 2

## 2023-05-10 NOTE — ED Triage Notes (Signed)
Pt arrived from home via POV c/o chest pain that began at 1540 05/09/23. Pt describes the pain as pressure 5/10 and states that it is keeping him awake.

## 2023-05-10 NOTE — ED Provider Notes (Signed)
Chest x-ray is wnl.   Gwyneth Sprout, MD 05/10/23 [phone]

## 2023-05-10 NOTE — ED Provider Notes (Signed)
EMERGENCY DEPARTMENT AT [facility] Provider Note  CSN: [id number] Arrival date & time: 05/10/23 0441  Chief Complaint(s) Chest Pain  HPI Don Martin is a 48 y.o. male {Add pertinent medical, surgical, social history, OB history to HPI:1}    Chest Pain Pain location:  L chest Pain quality: aching and sharp   Pain radiates to:  Does not radiate Pain severity:  Moderate Onset quality:  Gradual Duration:  12 hours Timing:  Constant Progression:  Waxing and waning Chronicity:  New Context: at rest   Context: not eating, not lifting, not movement and not raising an arm   Relieved by: not moving. Worsened by:  Certain positions and movement Associated symptoms: no cough, no fatigue, no fever, no headache, no heartburn, no nausea, no palpitations, no shortness of breath and no vomiting   Risk factors: diabetes mellitus, high cholesterol and male sex   Risk factors: no coronary artery disease, no hypertension, no immobilization, no prior DVT/PE and no smoking     Past Medical History Past Medical History:  Diagnosis Date   Arthritis    Diabetes mellitus without complication (HCC)    Gout    There are no active problems to display for this patient.  Home Medication(s) Prior to Admission medications   Medication Sig Start Date End Date Taking? Authorizing Provider  allopurinol (ZYLOPRIM) 300 MG tablet Take 300 mg by mouth daily. 06/22/19   [provider]  famotidine (PEPCID) 40 MG tablet Take 1 tablet (40 mg total) by mouth daily. 12/23/18   Gailen Shelter, PA  insulin glargine (LANTUS) 100 UNIT/ML Solostar Pen Inject 10-12 Units into the skin daily. If reading is over 100, inject 12 units. Otherwise, injects 10 units everyday 09/28/19   [provider]  diphenhydrAMINE (BENADRYL) 25 MG tablet Take 1 tablet (25 mg total) by mouth every 6 (six) hours as needed. Patient not taking: Reported on 10/04/2019 12/23/18 10/04/19  Gailen Shelter, PA                                                                                                                                     Allergies Shrimp [shellfish allergy]  Review of Systems Review of Systems  Constitutional:  Negative for fatigue and fever.  Respiratory:  Negative for cough and shortness of breath.   Cardiovascular:  Positive for chest pain. Negative for palpitations.  Gastrointestinal:  Negative for heartburn, nausea and vomiting.  Neurological:  Negative for headaches.   As noted in HPI  Physical Exam Vital Signs  I have reviewed the triage vital signs BP 131/88   Pulse 83   Temp 99.5 F (37.5 C) (Oral)   Resp 13   Ht 6\' 2"  (1.88 m)   Wt 111.1 kg   SpO2 100%   BMI 31.46 kg/m   Physical Exam Vitals reviewed.  Constitutional:      General: He is not  in acute distress.    Appearance: He is well-developed. He is not diaphoretic.  HENT:     Head: Normocephalic and atraumatic.     Nose: Nose normal.  Eyes:     General: No scleral icterus.       Right eye: No discharge.        Left eye: No discharge.     Conjunctiva/sclera: Conjunctivae normal.     Pupils: Pupils are equal, round, and reactive to light.  Cardiovascular:     Rate and Rhythm: Normal rate and regular rhythm.     Heart sounds: No murmur heard.    No friction rub. No gallop.  Pulmonary:     Effort: Pulmonary effort is normal. No respiratory distress.     Breath sounds: Normal breath sounds. No stridor. No rales.  Chest:     Chest wall: Tenderness present.    Abdominal:     General: There is no distension.     Palpations: Abdomen is soft.     Tenderness: There is no abdominal tenderness.  Musculoskeletal:        General: No tenderness.     Cervical back: Normal range of motion and neck supple.  Skin:    General: Skin is warm and dry.     Findings: No erythema or rash.  Neurological:     Mental Status: He is alert and oriented to person, place, and time.     ED Results and  Treatments Labs (all labs ordered are listed, but only abnormal results are displayed) Labs Reviewed  BASIC METABOLIC PANEL - Abnormal; Notable for the following components:      Result Value   Glucose, Bld 156 (*)    All other components within normal limits  CBC - Abnormal; Notable for the following components:   WBC 12.0 (*)    All other components within normal limits  TROPONIN I (HIGH SENSITIVITY)                                                                                                                         EKG  EKG Interpretation Date/Time:  Saturday May 10 2023 04:48:54 EST Ventricular Rate:  87 PR Interval:  164 QRS Duration:  82 QT Interval:  340 QTC Calculation: 409 R Axis:   59  Text Interpretation: Normal sinus rhythm Normal ECG When compared with ECG of 03-Oct-2019 20:50, PREVIOUS ECG IS PRESENT No significant change was found Confirmed by Drema Pry [phone]) on 05/10/2023 5:39:29 AM       Radiology No results found.  Medications Ordered in ED Medications  ketorolac (TORADOL) injection 30 mg (30 mg Intramuscular Given 05/10/23 0616)   Procedures Procedures  (including critical care time) Medical Decision Making / ED Course   Medical Decision Making Amount and/or Complexity of Data Reviewed Labs: ordered. Decision-making details documented in ED Course. Radiology: ordered and independent interpretation performed. Decision-making details documented in ED Course. ECG/medicine tests: ordered and independent interpretation performed. Decision-making details  documented in ED Course.  Risk Prescription drug management.    Chest pain differential and workup  Favoring chest wall pain. Muscular vs possible gout flare  Heart score less than 3.  EKG without acute ischemic changes or evidence of pericarditis.  Troponin more than 6 hours of constant pain negative.  Feel this sufficient to rule out ACS in his clinical picture  Presentation not  classic for arctic dissection or esophageal perforation  Chest x-ray without evidence of pneumonia, pneumothorax, pulmonary edema pleural effusion  CBC with leukocytosis.  No anemia. Metabolic panel without significant electrolyte derangement. Hyperglycemia w/o DKA.  Pain completely resolved following IM Toradol.    Final Clinical Impression(s) / ED Diagnoses Final diagnoses:  Chest wall pain    This chart was dictated using voice recognition software.  Despite best efforts to proofread,  errors can occur which can change the documentation meaning.

## 2024-02-13 ENCOUNTER — Other Ambulatory Visit: Payer: Self-pay

## 2024-02-13 ENCOUNTER — Emergency Department (HOSPITAL_COMMUNITY)

## 2024-02-13 ENCOUNTER — Encounter (HOSPITAL_COMMUNITY): Payer: Self-pay

## 2024-02-13 ENCOUNTER — Emergency Department (HOSPITAL_COMMUNITY)
Admission: EM | Admit: 2024-02-13 | Discharge: 2024-02-13 | Disposition: A | Discharge status: Home / Self Care | Attending: Emergency Medicine | Admitting: Emergency Medicine

## 2024-02-13 DIAGNOSIS — M549 Dorsalgia, unspecified: Secondary | ICD-10-CM | POA: Insufficient documentation

## 2024-02-13 DIAGNOSIS — Z794 Long term (current) use of insulin: Secondary | ICD-10-CM | POA: Insufficient documentation

## 2024-02-13 DIAGNOSIS — R0789 Other chest pain: Secondary | ICD-10-CM | POA: Diagnosis not present

## 2024-02-13 DIAGNOSIS — E119 Type 2 diabetes mellitus without complications: Secondary | ICD-10-CM | POA: Diagnosis not present

## 2024-02-13 DIAGNOSIS — R1032 Left lower quadrant pain: Secondary | ICD-10-CM | POA: Diagnosis not present

## 2024-02-13 DIAGNOSIS — R42 Dizziness and giddiness: Secondary | ICD-10-CM | POA: Diagnosis present

## 2024-02-13 DIAGNOSIS — Z7984 Long term (current) use of oral hypoglycemic drugs: Secondary | ICD-10-CM | POA: Diagnosis not present

## 2024-02-13 DIAGNOSIS — R10A2 Flank pain, left side: Secondary | ICD-10-CM

## 2024-02-13 LAB — TROPONIN I (HIGH SENSITIVITY)
Troponin I (High Sensitivity): 3 ng/L (ref <18)
Troponin I (High Sensitivity): <2 ng/L (ref <18)

## 2024-02-13 LAB — CBC
HCT: 43.2 % (ref 39.0–52.0)
Hemoglobin: 14.7 g/dL (ref 13.0–17.0)
MCH: 29.3 pg (ref 26.0–34.0)
MCHC: 34 g/dL (ref 30.0–36.0)
MCV: 86.1 fL (ref 80.0–100.0)
Platelets: 351 K/uL (ref 150–400)
RBC: 5.02 MIL/uL (ref 4.22–5.81)
RDW: 12.3 % (ref 11.5–15.5)
WBC: 13.3 K/uL — ABNORMAL HIGH (ref 4.0–10.5)
nRBC: 0 % (ref 0.0–0.2)

## 2024-02-13 LAB — HEPATIC FUNCTION PANEL
ALT: 48 U/L — ABNORMAL HIGH (ref 0–44)
AST: 31 U/L (ref 15–41)
Albumin: 3 g/dL — ABNORMAL LOW (ref 3.5–5.0)
Alkaline Phosphatase: 95 U/L (ref 38–126)
Bilirubin, Direct: 0.3 mg/dL — ABNORMAL HIGH (ref 0.0–0.2)
Indirect Bilirubin: 0.8 mg/dL (ref 0.3–0.9)
Total Bilirubin: 1.1 mg/dL (ref 0.0–1.2)
Total Protein: 6.2 g/dL — ABNORMAL LOW (ref 6.5–8.1)

## 2024-02-13 LAB — URINALYSIS, ROUTINE W REFLEX MICROSCOPIC
Bacteria, UA: NONE SEEN
Bilirubin Urine: NEGATIVE
Glucose, UA: >=500 mg/dL — AB
Hgb urine dipstick: NEGATIVE
Ketones, ur: NEGATIVE mg/dL
Leukocytes,Ua: NEGATIVE
Nitrite: NEGATIVE
Protein, ur: NEGATIVE mg/dL
Specific Gravity, Urine: 1.024 (ref 1.005–1.030)
pH: 5 (ref 5.0–8.0)

## 2024-02-13 LAB — BASIC METABOLIC PANEL WITH GFR
Anion gap: 14 (ref 5–15)
BUN: 9 mg/dL (ref 6–20)
CO2: 20 mmol/L — ABNORMAL LOW (ref 22–32)
Calcium: 9.1 mg/dL (ref 8.9–10.3)
Chloride: 98 mmol/L (ref 98–111)
Creatinine, Ser: 1.02 mg/dL (ref 0.61–1.24)
GFR, Estimated: >60 mL/min (ref >60)
Glucose, Bld: 303 mg/dL — ABNORMAL HIGH (ref 70–99)
Potassium: 4 mmol/L (ref 3.5–5.1)
Sodium: 132 mmol/L — ABNORMAL LOW (ref 135–145)

## 2024-02-13 LAB — LIPASE, BLOOD: Lipase: 120 U/L — ABNORMAL HIGH (ref 11–51)

## 2024-02-13 MED ORDER — IOHEXOL 350 MG/ML SOLN
100.0000 mL | Freq: Once | INTRAVENOUS | Status: AC | PRN
  Administered 2024-02-13: 100 mL via INTRAVENOUS

## 2024-02-13 MED ORDER — CYCLOBENZAPRINE HCL 10 MG PO TABS: 10.0000 mg | ORAL_TABLET | Freq: Two times a day (BID) | ORAL | 0 refills | Status: AC | PRN

## 2024-02-13 MED ORDER — SODIUM CHLORIDE 0.9 % IV BOLUS
1000.0000 mL | Freq: Once | INTRAVENOUS | Status: AC
  Administered 2024-02-13: 1000 mL via INTRAVENOUS

## 2024-02-13 MED ORDER — ONDANSETRON HCL 4 MG/2ML IJ SOLN
4.0000 mg | Freq: Once | INTRAMUSCULAR | Status: AC
  Administered 2024-02-13: 4 mg via INTRAVENOUS
  Filled 2024-02-13: qty 2

## 2024-02-13 MED ORDER — CELECOXIB 200 MG PO CAPS: 200.0000 mg | ORAL_CAPSULE | Freq: Two times a day (BID) | ORAL | 0 refills | Status: AC | PRN

## 2024-02-13 MED ORDER — KETOROLAC TROMETHAMINE 15 MG/ML IJ SOLN
15.0000 mg | Freq: Once | INTRAMUSCULAR | Status: AC
  Administered 2024-02-13: 15 mg via INTRAVENOUS
  Filled 2024-02-13: qty 1

## 2024-02-13 NOTE — ED Provider Notes (Signed)
 [place] EMERGENCY DEPARTMENT AT [facility] Provider Note   CSN: [id number] Arrival date & time: 02/13/24  1029     Patient presents with: Dizziness, Chest Pain, Back Pain, Abdominal Pain, and Nausea   Don Martin is a 48 y.o. male.    Dizziness Associated symptoms: chest pain   Chest Pain Associated symptoms: abdominal pain, back pain and dizziness   Back Pain Associated symptoms: abdominal pain and chest pain   Abdominal Pain Associated symptoms: chest pain     48 year old male presents emergency department complaints of left back pain, left lower abdominal pain, left-sided chest pain.  States that symptoms began 3 days ago when he was sitting at home.  States that symptoms been constant since onset.  States initially began in his left lower chest but now feels it mainly is left low back but sometimes radiates to his left lower abdomen.  Reports feeling of nausea.  Denies any known exacerbating or relieving factors.  Denies any fevers, chills, shortness of breath, cough, urinary symptoms, change in bowel habits.  Last bowel movement yesterday evening per patient and regular.  Has not had symptoms like this.  Denies any history of abdominal surgeries.  Has tried no medication for symptoms.  Denies any history of DVT/PE, recent surgery/immobilization or known coagulopathy, known malignancy, hormonal therapy.  States that he is on metformin for treatment of his diabetes and states that he has had somewhat elevated blood glucose ever since his symptoms began a few days ago in the 200s and sometimes low 300s.  Past medical history significant for diabetes mellitus, gout, arthritis.  Prior to Admission medications   Medication Sig Start Date End Date Taking? Authorizing Provider  allopurinol (ZYLOPRIM) 300 MG tablet Take 300 mg by mouth daily. 06/22/19   [provider]  famotidine  (PEPCID ) 40 MG tablet Take 1 tablet (40 mg total) by mouth daily. 12/23/18   Neldon Hamp RAMAN, PA  insulin glargine (LANTUS) 100 UNIT/ML Solostar Pen Inject 10-12 Units into the skin daily. If reading is over 100, inject 12 units. Otherwise, injects 10 units everyday 09/28/19   [provider]  diphenhydrAMINE  (BENADRYL ) 25 MG tablet Take 1 tablet (25 mg total) by mouth every 6 (six) hours as needed. Patient not taking: Reported on 10/04/2019 12/23/18 10/04/19  Neldon Hamp RAMAN, PA    Allergies: Shrimp [shellfish allergy]    Review of Systems  Cardiovascular:  Positive for chest pain.  Gastrointestinal:  Positive for abdominal pain.  Musculoskeletal:  Positive for back pain.  Neurological:  Positive for dizziness.  All other systems reviewed and are negative.   Updated Vital Signs BP 125/70   Pulse 66   Temp 98.1 F (36.7 C) (Oral)   Resp 16   Ht 6' 2 (1.88 m)   Wt 108.4 kg   SpO2 100%   BMI 30.69 kg/m   Physical Exam Vitals and nursing note reviewed.  Constitutional:      General: He is not in acute distress.    Appearance: He is well-developed.  HENT:     Head: Normocephalic and atraumatic.  Eyes:     Conjunctiva/sclera: Conjunctivae normal.  Cardiovascular:     Rate and Rhythm: Normal rate and regular rhythm.     Pulses: Normal pulses.     Heart sounds: No murmur heard. Pulmonary:     Effort: Pulmonary effort is normal. No respiratory distress.     Breath sounds: Normal breath sounds. No wheezing, rhonchi or rales.  Chest:  Chest wall: No tenderness.  Abdominal:     Palpations: Abdomen is soft.     Tenderness: There is abdominal tenderness. There is left CVA tenderness. There is no guarding.     Comments: Left lower quadrant abdominal tenderness.  Musculoskeletal:        General: No swelling.     Cervical back: Neck supple.     Right lower leg: No edema.     Left lower leg: No edema.  Skin:    General: Skin is warm and dry.     Capillary Refill: Capillary refill takes less than 2 seconds.  Neurological:     Mental Status: He is  alert.  Psychiatric:        Mood and Affect: Mood normal.     (all labs ordered are listed, but only abnormal results are displayed) Labs Reviewed  BASIC METABOLIC PANEL WITH GFR - Abnormal; Notable for the following components:      Result Value   Sodium 132 (*)    CO2 20 (*)    Glucose, Bld 303 (*)    All other components within normal limits  CBC - Abnormal; Notable for the following components:   WBC 13.3 (*)    All other components within normal limits  LIPASE, BLOOD - Abnormal; Notable for the following components:   Lipase 120 (*)    All other components within normal limits  URINALYSIS, ROUTINE W REFLEX MICROSCOPIC - Abnormal; Notable for the following components:   Glucose, UA >=500 (*)    All other components within normal limits  HEPATIC FUNCTION PANEL  TROPONIN I (HIGH SENSITIVITY)    EKG: None  Radiology: DG Chest 2 View Result Date: 02/13/2024 EXAM: 2 VIEW(S) XRAY OF THE CHEST 02/13/2024 11:13:00 AM COMPARISON: 05/10/2023 CLINICAL HISTORY: chest pain FINDINGS: LUNGS AND PLEURA: No focal pulmonary opacity. No pulmonary edema. No pleural effusion. No pneumothorax. HEART AND MEDIASTINUM: No acute abnormality of the cardiac and mediastinal silhouettes. BONES AND SOFT TISSUES: No acute osseous abnormality. IMPRESSION: 1. No acute cardiopulmonary process. Electronically signed by: Franky Stanford MD 02/13/2024 11:53 AM EDT RP Workstation: [id number]     Procedures   Medications Ordered in the ED  sodium chloride  0.9 % bolus 1,000 mL (has no administration in time range)  ondansetron  (ZOFRAN ) injection 4 mg (has no administration in time range)  ketorolac  (TORADOL ) 15 MG/ML injection 15 mg (15 mg Intravenous Given 02/13/24 1143)                                    Medical Decision Making Amount and/or Complexity of Data Reviewed Labs: ordered. Radiology: ordered.  Risk Prescription drug management.   This patient presents to the ED for concern of left flank  pain, this involves an extensive number of treatment options, and is a complaint that carries with it a high risk of complications and morbidity.  The differential diagnosis includes nephrolithiasis, pyelonephritis, pancreatitis, PE, aortic dissection, SBO/LBO, other   Co morbidities that complicate the patient evaluation  See HPI   Additional history obtained:  Additional history obtained from EMR External records from outside source obtained and reviewed including hospital records   Lab Tests:  I Ordered, and personally interpreted labs.  The pertinent results include: Leukocytosis of 13.3.  No evidence of anemia.  Platelets within range.  UA with 500 glucose otherwise unremarkable.  Troponin of 3 with repeat less than 2.  Mild hyponatremia  of 132, bicarb of 20.  No renal dysfunction.  ALT of 48.  Lipase of 120   Imaging Studies ordered:  I ordered imaging studies including chest x-ray, CT abdomen pelvis I independently visualized and interpreted imaging which showed  Chest x-ray: No acute cardiopulmonary abnormality CT abdomen pelvis: No acute abnormality.  Hepatic steatosis. I agree with the radiologist interpretation   Cardiac Monitoring: / EKG:  The patient was maintained on a cardiac monitor.  I personally viewed and interpreted the cardiac monitored which showed an underlying rhythm of: Normal sinus rhythm   Consultations Obtained:  mA   Problem List / ED Course / Critical interventions / Medication management  Left flank pain I ordered medication including Toradol , Zofran , normal saline   Reevaluation of the patient after these medicines showed that the patient improved I have reviewed the patients home medicines and have made adjustments as needed   Social Determinants of Health:  Denies tobacco, licit drug use.   Test / Admission - Considered:  Left flank pain Vitals signs within normal range and stable throughout visit. Laboratory/imaging studies  significant for: See above 48 year old male presents emergency department complaints of left back pain, left lower abdominal pain, left-sided chest pain.  States that symptoms began 3 days ago when he was sitting at home.  States that symptoms been constant since onset.  States initially began in his left lower chest but now feels it mainly is left low back but sometimes radiates to his left lower abdomen.  Reports feeling of nausea.  Denies any known exacerbating or relieving factors.  Denies any fevers, chills, shortness of breath, cough, urinary symptoms, change in bowel habits.  Last bowel movement yesterday evening per patient and regular.  Has not had symptoms like this.  Denies any history of abdominal surgeries.  Has tried no medication for symptoms.  Denies any history of DVT/PE, recent surgery/immobilization or known coagulopathy, known malignancy, hormonal therapy.  States that he is on metformin for treatment of his diabetes and states that he has had somewhat elevated blood glucose ever since his symptoms began a few days ago in the 200s and sometimes low 300s. On exam, left lower abdominal tenderness as well as left-sided CVA tenderness.  No appreciable rash.  Workup today reassuring.  Delta negative troponin, lack of acute ischemic change on EKG; low suspicion for ACS.  Patient PERC negative Wells PE 0; low suspicion for PE.  Chest x-ray without obvious pneumothorax, pneumonia or other acute cardiopulmonary abnormality.  From abdominal perspective, workup also reassuring.  CT imaging without any acute abnormality.  UA without obvious infections.  Suspect could be secondary to MSK etiology.  Will trial NSAIDs, muscle relaxer and recommend follow-up with primary care.  Treatment plan discussed with patient he acknowledged understanding was agreeable.  Patient well-appearing, afebrile in no acute distress. Worrisome signs and symptoms were discussed with the patient, and the patient acknowledged  understanding to return to the ED if noticed. Patient was stable upon discharge.       Final diagnoses:  None    ED Discharge Orders     None          Silver Wonda LABOR, [place] 02/13/24 1522    Dasie Faden, MD 02/14/24 [phone]

## 2024-02-13 NOTE — ED Triage Notes (Signed)
 C/O left sided flank/chest pain that started 3 days. Denies n/v/shob. Axox4.

## 2024-02-13 NOTE — ED Triage Notes (Signed)
 Pt. Stated, Don Martin had this pain in my chest and goes around to my stomach . Ive also had dizziness with nausea. . This started 3 days ago.

## 2024-02-13 NOTE — ED Notes (Signed)
 Extra Blue & DG top drawn

## 2024-02-13 NOTE — Discharge Instructions (Signed)
 As discussed, your workup today was reassuring.  Heart enzymes and EKG.  Normal.  Does not appear like you are having a heart attack.  CT imaging of your abdomen showed no obvious acute abnormality.  Your pancreas enzyme was elevated but I suspect your symptoms are more musculoskeletal in origin.  Will send you home with 2 different medications to use your symptoms.  Recommend follow-up with your primary care for reassessment.
